# Patient Record
Sex: Female | Born: 1980 | Hispanic: No | Marital: Married | Smoking: Former smoker
Health system: Southern US, Community
[De-identification: ages and names within clinical notes are randomized; demographics above are authoritative.]

## PROBLEM LIST (undated history)

## (undated) DIAGNOSIS — F1120 Opioid dependence, uncomplicated: Secondary | ICD-10-CM

## (undated) DIAGNOSIS — F419 Anxiety disorder, unspecified: Secondary | ICD-10-CM

## (undated) DIAGNOSIS — Z800 Family history of malignant neoplasm of digestive organs: Secondary | ICD-10-CM

## (undated) DIAGNOSIS — F32A Depression, unspecified: Secondary | ICD-10-CM

## (undated) DIAGNOSIS — B1920 Unspecified viral hepatitis C without hepatic coma: Secondary | ICD-10-CM

## (undated) DIAGNOSIS — F4310 Post-traumatic stress disorder, unspecified: Secondary | ICD-10-CM

## (undated) HISTORY — DX: Depression, unspecified: F32.A

## (undated) HISTORY — DX: Anxiety disorder, unspecified: F41.9

## (undated) HISTORY — DX: Unspecified viral hepatitis C without hepatic coma: B19.20

## (undated) HISTORY — DX: Post-traumatic stress disorder, unspecified: F43.10

## (undated) HISTORY — DX: Family history of malignant neoplasm of digestive organs: Z80.0

---

## 2019-11-30 DIAGNOSIS — J069 Acute upper respiratory infection, unspecified: Secondary | ICD-10-CM | POA: Diagnosis not present

## 2019-11-30 DIAGNOSIS — Z20822 Contact with and (suspected) exposure to covid-19: Secondary | ICD-10-CM | POA: Diagnosis not present

## 2020-06-12 ENCOUNTER — Ambulatory Visit: Payer: Self-pay | Admitting: Internal Medicine

## 2020-06-17 ENCOUNTER — Encounter: Payer: Self-pay | Admitting: Emergency Medicine

## 2020-06-17 ENCOUNTER — Other Ambulatory Visit: Payer: Self-pay

## 2020-06-17 DIAGNOSIS — R109 Unspecified abdominal pain: Secondary | ICD-10-CM | POA: Diagnosis not present

## 2020-06-17 DIAGNOSIS — F17210 Nicotine dependence, cigarettes, uncomplicated: Secondary | ICD-10-CM | POA: Insufficient documentation

## 2020-06-17 DIAGNOSIS — R1012 Left upper quadrant pain: Secondary | ICD-10-CM | POA: Diagnosis present

## 2020-06-17 DIAGNOSIS — R112 Nausea with vomiting, unspecified: Secondary | ICD-10-CM | POA: Diagnosis not present

## 2020-06-17 LAB — URINALYSIS, COMPLETE (UACMP) WITH MICROSCOPIC
Bacteria, UA: NONE SEEN
Bilirubin Urine: NEGATIVE
Glucose, UA: NEGATIVE mg/dL
Hgb urine dipstick: NEGATIVE
Ketones, ur: NEGATIVE mg/dL
Nitrite: NEGATIVE
Protein, ur: NEGATIVE mg/dL
Specific Gravity, Urine: 1.016 (ref 1.005–1.030)
pH: 6 (ref 5.0–8.0)

## 2020-06-17 LAB — CBC
HCT: 38.9 % (ref 36.0–46.0)
Hemoglobin: 12.6 g/dL (ref 12.0–15.0)
MCH: 28.5 pg (ref 26.0–34.0)
MCHC: 32.4 g/dL (ref 30.0–36.0)
MCV: 88 fL (ref 80.0–100.0)
Platelets: 282 10*3/uL (ref 150–400)
RBC: 4.42 MIL/uL (ref 3.87–5.11)
RDW: 13.2 % (ref 11.5–15.5)
WBC: 8.3 10*3/uL (ref 4.0–10.5)
nRBC: 0 % (ref 0.0–0.2)

## 2020-06-17 LAB — BASIC METABOLIC PANEL
Anion gap: 10 (ref 5–15)
BUN: 12 mg/dL (ref 6–20)
CO2: 19 mmol/L — ABNORMAL LOW (ref 22–32)
Calcium: 8.9 mg/dL (ref 8.9–10.3)
Chloride: 107 mmol/L (ref 98–111)
Creatinine, Ser: 0.91 mg/dL (ref 0.44–1.00)
GFR, Estimated: >60 mL/min (ref >60)
Glucose, Bld: 117 mg/dL — ABNORMAL HIGH (ref 70–99)
Potassium: 4.4 mmol/L (ref 3.5–5.1)
Sodium: 136 mmol/L (ref 135–145)

## 2020-06-17 LAB — POC URINE PREG, ED: Preg Test, Ur: NEGATIVE

## 2020-06-17 MED ORDER — FENTANYL CITRATE (PF) 100 MCG/2ML IJ SOLN
50.0000 ug | INTRAMUSCULAR | Status: DC | PRN
  Administered 2020-06-17: 50 ug via INTRAVENOUS
  Filled 2020-06-17: qty 2

## 2020-06-17 NOTE — ED Triage Notes (Signed)
Pt to ED from home c/o left flank pain that started suddenly at 1900 tonight, nausea but no vomiting.  Denies hx of kidney stones, has not urinated since pain started.  Pain is constant with intermittent severe sharp pain to left flank.  Patient unable to sit still and tearful in triage.

## 2020-06-18 ENCOUNTER — Encounter: Payer: Self-pay | Admitting: Emergency Medicine

## 2020-06-18 ENCOUNTER — Emergency Department: Payer: Medicaid Other

## 2020-06-18 ENCOUNTER — Emergency Department
Admission: EM | Admit: 2020-06-18 | Discharge: 2020-06-18 | Disposition: A | Discharge status: Home / Self Care | Payer: Medicaid Other | Attending: Emergency Medicine | Admitting: Emergency Medicine

## 2020-06-18 DIAGNOSIS — R109 Unspecified abdominal pain: Secondary | ICD-10-CM | POA: Diagnosis not present

## 2020-06-18 HISTORY — DX: Opioid dependence, uncomplicated: F11.20

## 2020-06-18 MED ORDER — KETOROLAC TROMETHAMINE 30 MG/ML IJ SOLN
15.0000 mg | Freq: Once | INTRAMUSCULAR | Status: AC
  Administered 2020-06-18: 15 mg via INTRAVENOUS
  Filled 2020-06-18: qty 1

## 2020-06-18 MED ORDER — DROPERIDOL 2.5 MG/ML IJ SOLN
2.5000 mg | Freq: Once | INTRAMUSCULAR | Status: AC
  Administered 2020-06-18: 2.5 mg via INTRAVENOUS
  Filled 2020-06-18: qty 2

## 2020-06-18 NOTE — ED Provider Notes (Signed)
[facility] Emergency Department Provider Note  ____________________________________________   First MD Initiated Contact with Patient 06/18/20 0540     (approximate)  I have reviewed the triage vital signs and the nursing notes.   HISTORY  Chief Complaint Flank Pain    HPI Lori Lawson is a 39 y.o. female with medical history as listed below who presents for evaluation of acute in onset sharp and stabbing pain in her left flank.  She said that happened around 7 PM tonight with no warning and no history of trauma.  The pain is severe and nonradiating.  It is accompanied with nausea and some vomiting.  She cannot find a position of comfort and was engaged in a lot of movement and pacing in triage and while she was waiting for an extended period of time to be seen (due to high ED patient volumes).  She has no pain in her pelvis or lower abdomen.  She has no dysuria and has not seen any blood in her urine.  She denies fever/chills, sore throat, chest pain, shortness of breath.  She has no history of kidney stones.         Past Medical History:  Diagnosis Date  . Opioid dependence (HCC)    prescribed Suboxone by her primary doctor    There are no problems to display for this patient.   Past Surgical History:  Procedure Laterality Date  . CESAREAN SECTION      Prior to Admission medications   Not on File    Allergies Patient has no known allergies.  History reviewed. No pertinent family history.  Social History Social History   Tobacco Use  . Smoking status: Current Every Day Smoker    Packs/day: 0.50    Types: Cigarettes  . Smokeless tobacco: Never Used  Substance Use Topics  . Alcohol use: Yes    Comment: occ.  . Drug use: Yes    Types: Marijuana    Review of Systems Constitutional: No fever/chills Eyes: No visual changes. ENT: No sore throat. Cardiovascular: Denies chest pain. Respiratory: Denies shortness of  breath. Gastrointestinal: Severe left flank pain accompanied with nausea and vomiting.  No abdominal pain.  No diarrhea.  No constipation. Genitourinary: Negative for dysuria. Musculoskeletal: Negative for neck pain.  Negative for back pain. Integumentary: Negative for rash. Neurological: Negative for headaches, focal weakness or numbness.   ____________________________________________   PHYSICAL EXAM:  VITAL SIGNS: ED Triage Vitals  Enc Vitals Group     BP 06/17/20 2147 120/86     Pulse Rate 06/17/20 2147 93     Resp 06/17/20 2147 20     Temp 06/17/20 2147 98.9 F (37.2 C)     Temp Source 06/17/20 2147 Oral     SpO2 06/17/20 2147 100 %     Weight 06/17/20 2148 61.2 kg (135 lb)     Height 06/17/20 2148 1.575 m (5\' 2" )     Head Circumference --      Peak Flow --      Pain Score 06/17/20 2148 10     Pain Loc --      Pain Edu? --      Excl. in GC? --     Constitutional: Alert and oriented.  Patient appears uncomfortable. Eyes: Conjunctivae are normal.  Head: Atraumatic. Nose: No congestion/rhinnorhea. Mouth/Throat: Patient is wearing a mask. Neck: No stridor.  No meningeal signs.   Cardiovascular: Normal rate, regular rhythm. Good peripheral circulation. Grossly normal heart sounds.  Respiratory: Normal respiratory effort.  No retractions. Gastrointestinal: Soft and nontender. No distention.  Musculoskeletal: Left CVA tenderness to percussion.  No lower extremity tenderness nor edema. No gross deformities of extremities. Neurologic:  Normal speech and language. No gross focal neurologic deficits are appreciated.  Skin:  Skin is warm, dry and intact. Psychiatric: Mood and affect are normal. Speech and behavior are normal.  ____________________________________________   LABS (all labs ordered are listed, but only abnormal results are displayed)  Labs Reviewed  URINALYSIS, COMPLETE (UACMP) WITH MICROSCOPIC - Abnormal; Notable for the following components:      Result  Value   Color, Urine YELLOW (*)    APPearance HAZY (*)    Leukocytes,Ua SMALL (*)    All other components within normal limits  BASIC METABOLIC PANEL - Abnormal; Notable for the following components:   CO2 19 (*)    Glucose, Bld 117 (*)    All other components within normal limits  CBC  POC URINE PREG, ED   ____________________________________________  EKG  No indication for emergent EKG ____________________________________________  RADIOLOGY I, Loleta Rose, personally viewed and evaluated these images (plain radiographs) as part of my medical decision making, as well as reviewing the written report by the radiologist.  ED MD interpretation: Extensive desiccated stool burden most of the colon, no obvious ureteral calculus other than a probable punctate right renal calculus, some calcification versus blood products at a nonenlarged right ovary.  Official radiology report(s): CT Renal Stone Study  Result Date: 06/18/2020 CLINICAL DATA:  Left flank pain with kidney stone suspected EXAM: CT ABDOMEN AND PELVIS WITHOUT CONTRAST TECHNIQUE: Multidetector CT imaging of the abdomen and pelvis was performed following the standard protocol without IV contrast. COMPARISON:  None. FINDINGS: Lower chest:  No contributory findings. Hepatobiliary: No focal liver abnormality.No evidence of biliary obstruction or stone. Pancreas: Unremarkable. Spleen: Unremarkable. Adrenals/Urinary Tract: Negative adrenals. No hydronephrosis or visible ureteral calculus. A Punctate right renal calculus is likely based on coronal reformats. Unremarkable bladder. Stomach/Bowel: No obstruction. No appendicitis. Desiccated stool throughout much of the colon. Vascular/Lymphatic: No acute vascular abnormality. No mass or adenopathy. Reproductive:High-density at the dependent right ovary which could be mineralization or blood products. No ovarian enlargement and this is contralateral to the side of symptoms. Other: No ascites or  pneumoperitoneum. Musculoskeletal: No acute abnormalities. IMPRESSION: 1. No hydronephrosis or ureteral calculus. 2. Desiccated stool in much of the colon. 3. Calcification versus is blood products at the nonenlarged right ovary, contralateral to the side of reported symptoms. 4. Probable punctate right renal calculus. Electronically Signed   By: Marnee Spring M.D.   On: 06/18/2020 05:55    ____________________________________________   PROCEDURES   Procedure(s) performed (including Critical Care):  Procedures   ____________________________________________   INITIAL IMPRESSION / MDM / ASSESSMENT AND PLAN / ED COURSE  As part of my medical decision making, I reviewed the following data within the electronic MEDICAL RECORD NUMBER Nursing notes reviewed and incorporated, Labs reviewed , Old chart reviewed, Notes from prior ED visits and [place] Controlled Substance Database   Differential diagnosis includes, but is not limited to, renal/ureteral colic, musculoskeletal pain, UTI/pyelonephritis, less likely renal infarction.  Vital signs been stable throughout her lengthy wait in the emergency department in spite of her acute pain.  She received fentanyl while she was waiting and then had an episode of vomiting.  Urine pregnancy test is negative, basic metabolic panel is normal, CBC is normal, urinalysis is unremarkable with a small amount of leukocytes but  no evidence of hematuria.  Based on the description of the patient's symptoms I strongly suspected ureteral colic.  I ordered a CT renal stone protocol and there was no evidence of an emergent or acute abnormality that would explain the patient's pain in her left flank.  I suspect that she may have had a stone which she passed while waiting and is still having some residual discomfort.  I reviewed her record and the [place] controlled substance database and verify that she takes Suboxone.  I had my usual and customary kidney stone/ureteral  colic discussion with the patient explained that there is not a stone visible now so she should start feeling better soon.  I also explained the degree of constipation seen on her scan and mention that this can also contribute to abdominal and back pain.  I gave her a dose of droperidol 2.5 mg IV and Toradol 15 mg IV and strongly encouraged her to follow-up as an outpatient and continue taking her regular medications.  She says she understands the plan and I gave my usual and customary return precautions.           ____________________________________________  FINAL CLINICAL IMPRESSION(S) / ED DIAGNOSES  Final diagnoses:  Left flank pain     MEDICATIONS GIVEN DURING THIS VISIT:  Medications  fentaNYL (SUBLIMAZE) injection 50 mcg (50 mcg Intravenous Given 06/17/20 2200)  droperidol (INAPSINE) 2.5 MG/ML injection 2.5 mg (2.5 mg Intravenous Given 06/18/20 0619)  ketorolac (TORADOL) 30 MG/ML injection 15 mg (15 mg Intravenous Given 06/18/20 1660)     ED Discharge Orders    None      *Please note:  Lori Lawson was evaluated in Emergency Department on 06/18/2020 for the symptoms described in the history of present illness. She was evaluated in the context of the global COVID-19 pandemic, which necessitated consideration that the patient might be at risk for infection with the SARS-CoV-2 virus that causes COVID-19. Institutional protocols and algorithms that pertain to the evaluation of patients at risk for COVID-19 are in a state of rapid change based on information released by regulatory bodies including the CDC and federal and state organizations. These policies and algorithms were followed during the patient's care in the ED.  Some ED evaluations and interventions may be delayed as a result of limited staffing during and after the pandemic.*  Note:  This document was prepared using Dragon voice recognition software and may include unintentional dictation errors.   Loleta Rose,  MD 06/18/20 [phone]

## 2020-06-18 NOTE — Discharge Instructions (Signed)
Your workup in the Emergency Department today was reassuring.  We did not find any specific abnormalities.  You may have had a kidney stone that you passed and are still feeling some pain.  We recommend you drink plenty of fluids, take your regular medications and/or any new ones prescribed today, and follow up with the doctor(s) listed in these documents as recommended.  Return to the Emergency Department if you develop new or worsening symptoms that concern you.

## 2020-07-22 ENCOUNTER — Ambulatory Visit: Payer: Self-pay | Admitting: Internal Medicine

## 2020-07-22 ENCOUNTER — Encounter: Payer: Self-pay | Admitting: Internal Medicine

## 2020-08-14 ENCOUNTER — Ambulatory Visit: Payer: Self-pay | Admitting: Internal Medicine

## 2020-09-25 ENCOUNTER — Other Ambulatory Visit: Payer: Self-pay

## 2020-09-25 ENCOUNTER — Ambulatory Visit (INDEPENDENT_AMBULATORY_CARE_PROVIDER_SITE_OTHER): Payer: Medicaid Other | Admitting: Internal Medicine

## 2020-09-25 ENCOUNTER — Encounter: Payer: Self-pay | Admitting: Internal Medicine

## 2020-09-25 VITALS — BP 126/92 | HR 99 | Temp 98.6°F | Ht 62.0 in | Wt 136.0 lb

## 2020-09-25 DIAGNOSIS — F331 Major depressive disorder, recurrent, moderate: Secondary | ICD-10-CM

## 2020-09-25 DIAGNOSIS — B1920 Unspecified viral hepatitis C without hepatic coma: Secondary | ICD-10-CM | POA: Insufficient documentation

## 2020-09-25 NOTE — Progress Notes (Signed)
Date:  09/25/2020   Name:  Lori Lawson   DOB:  03-Feb-1981   MRN:  [id number]   Chief Complaint: Establish Care  Depression        This is a chronic problem.  Associated symptoms include no fatigue and no headaches.  Past treatments include SSRIs - Selective serotonin reuptake inhibitors and other medications.  Compliance with treatment is good.  Hepatitis C - she was diagnosed about 2 years ago when she was hospitalized with a tylenol overdose.  She was about to begin treatment when she decided to move here.  She denies abdominal pain, jaundice, weight loss.  Hx of drug use - started with pills then moved to heroin.  She was able to get her life in order and began Suboxone several years ago.  She has maintained on that more or less continuously.  She moved here [place] be with family [place]   Lab Results  Component Value Date   CREATININE 0.91 06/17/2020   BUN 12 06/17/2020   NA 136 06/17/2020   K 4.4 06/17/2020   CL 107 06/17/2020   CO2 19 (L) 06/17/2020   No results found for: CHOL, HDL, LDLCALC, LDLDIRECT, TRIG, CHOLHDL No results found for: TSH No results found for: HGBA1C Lab Results  Component Value Date   WBC 8.3 06/17/2020   HGB 12.6 06/17/2020   HCT 38.9 06/17/2020   MCV 88.0 06/17/2020   PLT 282 06/17/2020   No results found for: ALT, AST, GGT, ALKPHOS, BILITOT   Review of Systems  Constitutional: Negative for chills, fatigue, fever and unexpected weight change.  Respiratory: Negative for cough, chest tightness, shortness of breath and wheezing.   Cardiovascular: Negative for chest pain and palpitations.  Gastrointestinal: Negative for abdominal pain.  Genitourinary: Negative for difficulty urinating.  Musculoskeletal: Negative for arthralgias, gait problem and joint swelling.  Neurological: Negative for dizziness, light-headedness and headaches.  Psychiatric/Behavioral: Positive for  depression and dysphoric mood. Negative for sleep disturbance. The patient is nervous/anxious.     There are no problems to display for this patient.   No Known Allergies  Past Surgical History:  Procedure Laterality Date  . CESAREAN SECTION      Social History   Tobacco Use  . Smoking status: Current Every Day Smoker    Packs/day: 0.00    Types: Cigarettes  . Smokeless tobacco: Never Used  . Tobacco comment: in the process of quitting - 3 cigs a day   Vaping Use  . Vaping Use: Every day  Substance Use Topics  . Alcohol use: Yes    Comment: occ.  . Drug use: Yes    Types: Marijuana     Medication list has been reviewed and updated.  Current Meds  Medication Sig  . buprenorphine-naloxone (SUBOXONE) 8-2 mg SUBL SL tablet Place 1 tablet under the tongue in the morning and at bedtime.  Marland Kitchen buPROPion (WELLBUTRIN XL) 150 MG 24 hr tablet Take 150 mg by mouth in the morning and at bedtime.  . busPIRone (BUSPAR) 15 MG tablet Take 15 mg by mouth in the morning and at bedtime.  . hydrOXYzine (VISTARIL) 50 MG capsule Take 50 mg by mouth 3 (three) times daily as needed. [facility] [employer]  . ibuprofen (ADVIL) 600 MG tablet Take 600 mg by mouth as needed.  . sertraline (ZOLOFT) 100 MG tablet Take 200 mg by mouth daily. Dr.  Tr[facility]    [facility] 2/9 Scores 09/25/2020  PHQ - 2 Score 2  PHQ- 9 Score 10    GAD 7 : Generalized Anxiety Score 09/25/2020  Nervous, Anxious, on Edge 3  Control/stop worrying 3  Worry too much - different things 3  Trouble relaxing 3  Restless 3  Easily annoyed or irritable 2  Afraid - awful might happen 1  Total GAD 7 Score 18    BP Readings from Last 3 Encounters:  09/25/20 (!) 126/92  06/18/20 117/86    Physical Exam Vitals and nursing note reviewed.  Constitutional:      General: She is not in acute distress.    Appearance: Normal appearance. She is well-developed.  HENT:     Head: Normocephalic and atraumatic.  Cardiovascular:     Rate  and Rhythm: Normal rate and regular rhythm.     Pulses: Normal pulses.     Heart sounds: No murmur heard.   Pulmonary:     Effort: Pulmonary effort is normal. No respiratory distress.  Abdominal:     General: Abdomen is flat. Bowel sounds are normal. There is no distension.     Palpations: Abdomen is soft.     Tenderness: There is no abdominal tenderness. There is no guarding or rebound.  Musculoskeletal:     Cervical back: Normal range of motion.     Right lower leg: No edema.     Left lower leg: No edema.  Lymphadenopathy:     Cervical: No cervical adenopathy.  Skin:    General: Skin is warm and dry.     Findings: No rash.  Neurological:     General: No focal deficit present.     Mental Status: She is alert and oriented to person, place, and time.  Psychiatric:        Mood and Affect: Mood normal.        Behavior: Behavior normal.     Wt Readings from Last 3 Encounters:  09/25/20 136 lb (61.7 kg)  06/17/20 135 lb (61.2 kg)    BP (!) 126/92   Pulse 99   Temp 98.6 F (37 C) (Oral)   Ht 5\' 2"  (1.575 m)   Wt 136 lb (61.7 kg)   LMP 09/12/2019   SpO2 98%   BMI 24.87 kg/m   Assessment and Plan: 1. Hepatitis C virus infection without hepatic coma, unspecified chronicity Will obtain labs then refer for treatment if still active - Hepatitis C RNA quantitative - Comprehensive metabolic panel - CBC with Differential/Platelet  2. Moderate episode of recurrent major depressive disorder (HCC) Recommend continuing with [facility] for now She can look for another program but they are providing Suboxone so I would not change - TSH  Schedule CPX and Pap Partially dictated using [facility]. Any errors are unintentional.  Animal nutritionist, MD [facility]  09/25/2020

## 2020-09-26 LAB — COMPREHENSIVE METABOLIC PANEL
ALT: 48 IU/L — ABNORMAL HIGH (ref 0–32)
AST: 49 IU/L — ABNORMAL HIGH (ref 0–40)
Albumin/Globulin Ratio: 1.5 (ref 1.2–2.2)
Albumin: 4.6 g/dL (ref 3.8–4.8)
Alkaline Phosphatase: 95 IU/L (ref 44–121)
BUN/Creatinine Ratio: 16 (ref 9–23)
BUN: 13 mg/dL (ref 6–20)
Bilirubin Total: 0.3 mg/dL (ref 0.0–1.2)
CO2: 22 mmol/L (ref 20–29)
Calcium: 9.7 mg/dL (ref 8.7–10.2)
Chloride: 100 mmol/L (ref 96–106)
Creatinine, Ser: 0.81 mg/dL (ref 0.57–1.00)
Globulin, Total: 3 g/dL (ref 1.5–4.5)
Glucose: 84 mg/dL (ref 65–99)
Potassium: 4.5 mmol/L (ref 3.5–5.2)
Sodium: 135 mmol/L (ref 134–144)
Total Protein: 7.6 g/dL (ref 6.0–8.5)
eGFR: 95 mL/min/{1.73_m2} (ref >59)

## 2020-09-26 LAB — CBC WITH DIFFERENTIAL/PLATELET
Basophils Absolute: 0 10*3/uL (ref 0.0–0.2)
Basos: 0 %
EOS (ABSOLUTE): 0.1 10*3/uL (ref 0.0–0.4)
Eos: 2 %
Hematocrit: 40.1 % (ref 34.0–46.6)
Hemoglobin: 13.2 g/dL (ref 11.1–15.9)
Immature Grans (Abs): 0 10*3/uL (ref 0.0–0.1)
Immature Granulocytes: 0 %
Lymphocytes Absolute: 1.4 10*3/uL (ref 0.7–3.1)
Lymphs: 29 %
MCH: 28.6 pg (ref 26.6–33.0)
MCHC: 32.9 g/dL (ref 31.5–35.7)
MCV: 87 fL (ref 79–97)
Monocytes Absolute: 0.5 10*3/uL (ref 0.1–0.9)
Monocytes: 10 %
Neutrophils Absolute: 2.8 10*3/uL (ref 1.4–7.0)
Neutrophils: 59 %
Platelets: 355 10*3/uL (ref 150–450)
RBC: 4.62 x10E6/uL (ref 3.77–5.28)
RDW: 13.2 % (ref 11.7–15.4)
WBC: 4.8 10*3/uL (ref 3.4–10.8)

## 2020-09-26 LAB — TSH: TSH: 1.68 u[IU]/mL (ref 0.450–4.500)

## 2020-09-26 NOTE — Addendum Note (Signed)
Addended by: Reubin Milan on: 09/26/2020 02:02 PM   Modules accepted: Orders

## 2020-09-28 ENCOUNTER — Other Ambulatory Visit: Payer: Self-pay | Admitting: Internal Medicine

## 2020-09-28 DIAGNOSIS — B1920 Unspecified viral hepatitis C without hepatic coma: Secondary | ICD-10-CM

## 2020-09-28 LAB — HEPATITIS C GENOTYPE: Hepatitis C Genotype: 3

## 2020-09-28 LAB — HCV RNA QUANT RFLX ULTRA OR GENOTYP
HCV Quant Baseline: 162000 IU/mL
HCV log10: 5.21 log10 IU/mL

## 2020-11-19 ENCOUNTER — Ambulatory Visit (INDEPENDENT_AMBULATORY_CARE_PROVIDER_SITE_OTHER): Payer: Medicaid Other | Admitting: Gastroenterology

## 2020-11-19 ENCOUNTER — Encounter: Payer: Self-pay | Admitting: Gastroenterology

## 2020-11-19 VITALS — BP 101/69 | HR 92 | Ht 62.0 in | Wt 155.2 lb

## 2020-11-19 DIAGNOSIS — B182 Chronic viral hepatitis C: Secondary | ICD-10-CM

## 2020-11-19 NOTE — Progress Notes (Signed)
Gastroenterology Consultation  Referring Provider:     Reubin Milan, MD Primary Care Physician:  Lori Milan, MD Primary Gastroenterologist:  Dr. Servando Snare     Reason for Consultation:     Hepatitis C        HPI:   Lori Lawson is a 40 y.o. y/o female referred for consultation & management of hepatitis C by Dr. Judithann Lawson, Lori Cowden, MD.  This patient comes in today after being found to have abnormal liver enzymes and had her hepatitis C checked by her primary care provider and was found to have genotype 3 with a viral load of 162,000 and international units per milliliter.  It was reported that the patient was diagnosed 2 years prior to seeing her primary care provider when she was admitted to the hospital for a Tylenol overdose.  There is a history of heroin abuse. The patient reports that she has not been using IV drugs anymore.  She also states that she does not use any other illicit substances except occasional marijuana use.  Past Medical History:  Diagnosis Date  . Anxiety   . Depression   . Hepatitis C   . Opioid dependence (HCC)    prescribed Suboxone by her primary doctor  . PTSD (post-traumatic stress disorder)     Past Surgical History:  Procedure Laterality Date  . CESAREAN SECTION      Prior to Admission medications   Medication Sig Start Date End Date Taking? Authorizing Provider  buprenorphine-naloxone (SUBOXONE) 8-2 mg SUBL SL tablet Place 1 tablet under the tongue in the morning and at bedtime.    [provider]  buPROPion (WELLBUTRIN XL) 150 MG 24 hr tablet Take 150 mg by mouth in the morning and at bedtime.    [provider]  busPIRone (BUSPAR) 15 MG tablet Take 15 mg by mouth in the morning and at bedtime.    [provider]  hydrOXYzine (VISTARIL) 50 MG capsule Take 50 mg by mouth 3 (three) times daily as needed. [facility] [employer], Historical, MD  ibuprofen (ADVIL) 600 MG tablet Take 600 mg by mouth as  needed.    [provider]  sertraline (ZOLOFT) 100 MG tablet Take 200 mg by mouth daily. Dr.   Evlyn Clines    [provider]    Family History  Problem Relation Age of Onset  . Pancreatic cancer Mother 107  . Diabetes Mother   . Alcohol abuse Mother   . Hyperlipidemia Mother   . Hypertension Mother   . Tuberculosis Paternal Grandmother   . Lung cancer Paternal Grandfather      Social History   Tobacco Use  . Smoking status: Current Every Day Smoker    Packs/day: 0.00    Types: Cigarettes  . Smokeless tobacco: Never Used  . Tobacco comment: in the process of quitting - 3 cigs a day   Vaping Use  . Vaping Use: Every day  Substance Use Topics  . Alcohol use: Yes    Comment: occ.  . Drug use: Yes    Types: Marijuana    Allergies as of 11/19/2020  . (No Known Allergies)    Review of Systems:    All systems reviewed and negative except where noted in HPI.   Physical Exam:  There were no vitals taken for this visit. No LMP recorded. General:   Alert,  Well-developed, well-nourished, pleasant and cooperative in NAD Head:  Normocephalic and atraumatic. Eyes:  Sclera  clear, no icterus.   Conjunctiva pink. Ears:  Normal auditory acuity. Neck:  Supple; no masses or thyromegaly. Lungs:  Respirations even and unlabored.  Clear throughout to auscultation.   No wheezes, crackles, or rhonchi. No acute distress. Heart:  Regular rate and rhythm; no murmurs, clicks, rubs, or gallops. Abdomen:  Normal bowel sounds.  No bruits.  Soft, non-tender and non-distended without masses, hepatosplenomegaly or hernias noted.  No guarding or rebound tenderness.  Negative Carnett sign.   Rectal:  Deferred.  Pulses:  Normal pulses noted. Extremities:  No clubbing or edema.  No cyanosis. Neurologic:  Alert and oriented x3;  grossly normal neurologically. Skin:  Intact without significant lesions or rashes.  No jaundice. Lymph Nodes:  No significant cervical adenopathy. Psych:   Alert and cooperative. Normal mood and affect.  Imaging Studies: No results found.  Assessment and Plan:   Lori Lawson is a 40 y.o. y/o female who comes in today with a history of abnormal liver enzymes with the resulting finding of hepatitis C genotype 3 with a viral level of 162,000 international units/mL.  The patient will have lab sent off for other possible cause of abnormal liver enzymes and she will also have the amount of fibrosis determined.  She will also be checked for her immunity to hepatitis A and hepatitis B and will be vaccinated accordingly.  Once this is back the patient will then be started on treatment for her hepatitis C.  The patient has been explained the plan and agrees with it    Lori Minium, MD. Lori Lawson    Note: This dictation was prepared with Dragon dictation along with smaller phrase technology. Any transcriptional errors that result from this process are unintentional.

## 2020-11-22 DIAGNOSIS — B182 Chronic viral hepatitis C: Secondary | ICD-10-CM | POA: Diagnosis not present

## 2020-11-26 DIAGNOSIS — N390 Urinary tract infection, site not specified: Secondary | ICD-10-CM | POA: Diagnosis not present

## 2020-11-26 LAB — HCV FIBROSURE
ALPHA 2-MACROGLOBULINS, QN: 234 mg/dL (ref 110–276)
ALT (SGPT) P5P: 44 IU/L — ABNORMAL HIGH (ref 0–40)
Apolipoprotein A-1: 202 mg/dL (ref 116–209)
Bilirubin, Total: <0.1 mg/dL (ref 0.0–1.2)
Fibrosis Score: 0.03 (ref 0.00–0.21)
GGT: 29 IU/L (ref 0–60)
Haptoglobin: 143 mg/dL (ref 33–278)
Necroinflammat Activity Score: 0.18 — ABNORMAL HIGH (ref 0.00–0.17)

## 2020-11-26 LAB — HEPATITIS B SURFACE ANTIGEN: Hepatitis B Surface Ag: NEGATIVE

## 2020-11-26 LAB — IRON AND TIBC
Iron Saturation: 23 % (ref 15–55)
Iron: 94 ug/dL (ref 27–159)
Total Iron Binding Capacity: 404 ug/dL (ref 250–450)
UIBC: 310 ug/dL (ref 131–425)

## 2020-11-26 LAB — FERRITIN: Ferritin: 18 ng/mL (ref 15–150)

## 2020-11-26 LAB — ANTI-SMOOTH MUSCLE ANTIBODY, IGG: Smooth Muscle Ab: 8 Units (ref 0–19)

## 2020-11-26 LAB — HEPATIC FUNCTION PANEL
ALT: 42 IU/L — ABNORMAL HIGH (ref 0–32)
AST: 57 IU/L — ABNORMAL HIGH (ref 0–40)
Albumin: 4.4 g/dL (ref 3.8–4.8)
Alkaline Phosphatase: 130 IU/L — ABNORMAL HIGH (ref 44–121)
Bilirubin Total: 0.2 mg/dL (ref 0.0–1.2)
Bilirubin, Direct: <0.1 mg/dL (ref 0.00–0.40)
Total Protein: 7 g/dL (ref 6.0–8.5)

## 2020-11-26 LAB — HEPATITIS A ANTIBODY, TOTAL: hep A Total Ab: NEGATIVE

## 2020-11-26 LAB — ALPHA-1-ANTITRYPSIN: A-1 Antitrypsin: 133 mg/dL (ref 100–188)

## 2020-11-26 LAB — MITOCHONDRIAL ANTIBODIES: Mitochondrial Ab: <20 Units (ref 0.0–20.0)

## 2020-11-26 LAB — HEPATITIS B SURFACE ANTIBODY,QUALITATIVE: Hep B Surface Ab, Qual: NONREACTIVE

## 2020-11-26 LAB — CERULOPLASMIN: Ceruloplasmin: 30.6 mg/dL (ref 19.0–39.0)

## 2020-11-26 LAB — ANA: Anti Nuclear Antibody (ANA): NEGATIVE

## 2020-11-27 ENCOUNTER — Telehealth: Payer: Self-pay

## 2020-11-27 ENCOUNTER — Ambulatory Visit: Payer: Medicaid Other | Admitting: Internal Medicine

## 2020-11-27 NOTE — Telephone Encounter (Signed)
-----   Message from Midge Minium, MD sent at 11/25/2020 11:52 AM EDT ----- Let the patient know that she needs to be vaccinated for hepatitis A and hepatitis B.  Once the fibrosis score comes back then we can start her on treatment for her hepatitis C.

## 2020-11-27 NOTE — Telephone Encounter (Signed)
Left vm again for pt to return my call. Results have been sent through mychart.

## 2020-12-03 ENCOUNTER — Telehealth: Payer: Self-pay

## 2020-12-03 ENCOUNTER — Other Ambulatory Visit: Payer: Self-pay

## 2020-12-03 MED ORDER — MAVYRET 100-40 MG PO TABS: 3.0000 | ORAL_TABLET | Freq: Every day | ORAL | 1 refills | Status: AC

## 2020-12-03 NOTE — Telephone Encounter (Signed)
-----   Message from Midge Minium, MD sent at 12/02/2020 12:08 PM EDT ----- Let the patient know the fibrosis score came back and there was no fibrosis.  The patient should be started on her treatment for hepatitis C

## 2020-12-03 NOTE — Telephone Encounter (Signed)
Pt has been notified of lab results. Paperwork for Hep C treatment has been completed and faxed to [employer].

## 2020-12-09 ENCOUNTER — Other Ambulatory Visit: Payer: Self-pay

## 2020-12-09 ENCOUNTER — Ambulatory Visit: Payer: Medicaid Other

## 2020-12-09 DIAGNOSIS — Z23 Encounter for immunization: Secondary | ICD-10-CM

## 2020-12-09 MED ORDER — PANTOPRAZOLE SODIUM 40 MG PO TBEC: 40.0000 mg | DELAYED_RELEASE_TABLET | Freq: Every day | ORAL | 6 refills | Status: DC

## 2020-12-11 ENCOUNTER — Encounter: Payer: Self-pay | Admitting: Obstetrics

## 2020-12-11 ENCOUNTER — Ambulatory Visit (INDEPENDENT_AMBULATORY_CARE_PROVIDER_SITE_OTHER): Payer: Medicaid Other | Admitting: Obstetrics

## 2020-12-11 ENCOUNTER — Other Ambulatory Visit: Payer: Self-pay

## 2020-12-11 ENCOUNTER — Other Ambulatory Visit (HOSPITAL_COMMUNITY)
Admission: RE | Admit: 2020-12-11 | Discharge: 2020-12-11 | Disposition: A | Discharge status: Home / Self Care | Payer: Medicaid Other | Source: Ambulatory Visit | Attending: Obstetrics | Admitting: Obstetrics

## 2020-12-11 VITALS — BP 100/60 | Ht 61.5 in | Wt 144.0 lb

## 2020-12-11 DIAGNOSIS — Z113 Encounter for screening for infections with a predominantly sexual mode of transmission: Secondary | ICD-10-CM | POA: Diagnosis not present

## 2020-12-11 DIAGNOSIS — Z124 Encounter for screening for malignant neoplasm of cervix: Secondary | ICD-10-CM

## 2020-12-11 DIAGNOSIS — Z0000 Encounter for general adult medical examination without abnormal findings: Secondary | ICD-10-CM

## 2020-12-11 NOTE — Progress Notes (Signed)
Gynecology Annual Exam   PCP: Reubin Milan, MD  Chief Complaint:  Chief Complaint  Patient presents with  . Gynecologic Exam  . Contraception    History of Present Illness: Patient is a 40 y.o. [id number] presents for annual exam. The patient has no complaints today.   LMP: Patient's last menstrual period was 11/12/2020. Average Interval: regular, 28 days Duration of flow: 5 days Heavy Menses: no Clots: no Intermenstrual Bleeding: no Postcoital Bleeding: no Dysmenorrhea: no  The patient is sexually active. She currently uses none for contraception. She denies dyspareunia.  The patient does perform self breast exams.  There is no notable family history of breast or ovarian cancer in her family.  The patient wears seatbelts: yes.   The patient has regular exercise: no.    The patient denies current symptoms of depression.    Review of Systems: Review of Systems  Constitutional: Negative.   HENT: Negative.   Eyes: Negative.   Respiratory: Negative.   Cardiovascular: Negative.   Gastrointestinal: Negative.   Genitourinary: Negative.   Musculoskeletal: Negative.   Skin: Negative.   Neurological: Negative.   Endo/Heme/Allergies: Negative.   Psychiatric/Behavioral: The patient is nervous/anxious.     Past Medical History:  Patient Active Problem List   Diagnosis Date Noted  . Hepatitis C virus infection without hepatic coma 09/25/2020    Past Surgical History:  Past Surgical History:  Procedure Laterality Date  . CESAREAN SECTION      Gynecologic History:  Patient's last menstrual period was 11/12/2020. Contraception: none Last Pap: Results were: no abnormalities   Obstetric History: [id number]  Family History:  Family History  Problem Relation Age of Onset  . Pancreatic cancer Mother 37  . Diabetes Mother   . Alcohol abuse Mother   . Hyperlipidemia Mother   . Hypertension Mother   . Tuberculosis Paternal Grandmother   . Lung cancer Paternal  Grandfather     Social History:  Social History   Socioeconomic History  . Marital status: Single    Spouse name: Not on file  . Number of children: 1  . Years of education: Not on file  . Highest education level: Not on file  Occupational History  . Not on file  Tobacco Use  . Smoking status: Current Every Day Smoker    Packs/day: 0.00    Types: Cigarettes  . Smokeless tobacco: Never Used  . Tobacco comment: in the process of quitting - 3 cigs a day   Vaping Use  . Vaping Use: Every day  Substance and Sexual Activity  . Alcohol use: Yes    Comment: occ.  . Drug use: Yes    Types: Marijuana  . Sexual activity: Yes    Birth control/protection: None  Other Topics Concern  . Not on file  Social History Narrative  . Not on file   Social Determinants of Health   Financial Resource Strain: Not on file  Food Insecurity: Not on file  Transportation Needs: Not on file  Physical Activity: Not on file  Stress: Not on file  Social Connections: Not on file  Intimate Partner Violence: Not on file    Allergies:  No Known Allergies  Medications: Prior to Admission medications   Medication Sig Start Date End Date Taking? Authorizing Provider  buprenorphine-naloxone (SUBOXONE) 8-2 mg SUBL SL tablet Place 1 tablet under the tongue in the morning and at bedtime.   Yes [provider]  buPROPion (WELLBUTRIN XL) 150 MG 24 hr  tablet Take 150 mg by mouth in the morning and at bedtime.   Yes [provider]  busPIRone (BUSPAR) 15 MG tablet Take 15 mg by mouth in the morning and at bedtime.   Yes [provider]  Glecaprevir-Pibrentasvir (MAVYRET) 100-40 MG TABS Take 3 tablets by mouth daily for 28 days. 300mg /120mg  12/03/20 12/31/20 Yes Wohl, Darren, MD  hydrOXYzine (VISTARIL) 50 MG capsule Take 50 mg by mouth 3 (three) times daily as needed. [facility]   Yes [provider]  ibuprofen (ADVIL) 600 MG tablet Take 600 mg by mouth as needed.    Yes [provider]  pantoprazole (PROTONIX) 40 MG tablet Take 1 tablet (40 mg total) by mouth daily. 12/09/20  Yes 12/11/20, MD  sertraline (ZOLOFT) 100 MG tablet Take 200 mg by mouth daily. Dr.   Midge Minium   Yes [provider]    Physical Exam Vitals: Blood pressure 100/60, height 5' 1.5" (1.562 m), weight 144 lb (65.3 kg), last menstrual period 11/12/2020.  General: NAD, but talks rapidly during the visit- Appears anxious. HEENT: normocephalic, anicteric Thyroid: no enlargement, no palpable nodules Pulmonary: No increased work of breathing, CTAB Cardiovascular: RRR, distal pulses 2+ Breast: Breast symmetrical, no tenderness, no palpable nodules or masses, no skin or nipple retraction present, no nipple discharge.  No axillary or supraclavicular lymphadenopathy. Abdomen: NABS, soft, non-tender, non-distended.  Umbilicus without lesions.  No hepatomegaly, splenomegaly or masses palpable. No evidence of hernia  Genitourinary:  External: Normal external female genitalia.  Normal urethral meatus, normal Bartholin's and Skene's glands.    Vagina: Normal vaginal mucosa, no evidence of prolapse.  Narrow pelvis noted, with narrow arch and outlet.  Cervix: Grossly normal in appearance, no bleeding  Uterus: Non-enlarged, mobile, normal contour.  No CMT  Adnexa: ovaries non-enlarged, no adnexal masses  Rectal: deferred  Lymphatic: no evidence of inguinal lymphadenopathy Extremities: no edema, erythema, or tenderness Neurologic: Grossly intact Psychiatric: mood appropriate, affect full  Female chaperone present for pelvic and breast  portions of the physical exam    Assessment: 40 y.o. 24 routine annual exam STI screening Pap smear Declining contraceptive management  Plan: Problem List Items Addressed This Visit   None     2) STI screening  wasoffered and accepted  2)  ASCCP guidelines and rational discussed.  Patient opts for every 3 years screening  interval  3) Contraception - the patient is currently using  none and she has been sexually active for years and not gotten pregnanct. Wh was told years ago that she likely has PCOS..  She is happy with her current form of contraception and plans to continue. She would welcome a pregnancy.  4) Routine healthcare maintenance including cholesterol, diabetes screening discussed managed by PCP  5) No follow-ups on file.   [id number], CNM  12/11/2020 6:51 PM   [facility], [facility] 12/11/2020, 3:31 PM

## 2020-12-12 ENCOUNTER — Encounter: Payer: Self-pay | Admitting: Obstetrics and Gynecology

## 2020-12-13 ENCOUNTER — Other Ambulatory Visit: Payer: Self-pay | Admitting: Obstetrics

## 2020-12-13 DIAGNOSIS — Z30011 Encounter for initial prescription of contraceptive pills: Secondary | ICD-10-CM | POA: Diagnosis not present

## 2020-12-13 LAB — CYTOLOGY - PAP
Adequacy: ABSENT
Chlamydia: NEGATIVE
Comment: NEGATIVE
Comment: NEGATIVE
Comment: NEGATIVE
Comment: NORMAL
Diagnosis: NEGATIVE
High risk HPV: NEGATIVE
Neisseria Gonorrhea: NEGATIVE
Trichomonas: NEGATIVE

## 2020-12-13 LAB — HEP, RPR, HIV PANEL
HIV Screen 4th Generation wRfx: NONREACTIVE
RPR Ser Ql: NONREACTIVE

## 2020-12-13 LAB — HEPATITIS B SURFACE AG, CONFIRM: HBsAG Confirmation: POSITIVE — AB

## 2020-12-13 MED ORDER — NORETHIN ACE-ETH ESTRAD-FE 1-20 MG-MCG PO TABS: 1.0000 | ORAL_TABLET | Freq: Every day | ORAL | 11 refills | Status: DC

## 2020-12-13 NOTE — Progress Notes (Signed)
Gynecology Annual Exam   PCP: Reubin Milan, MD  Chief Complaint: No chief complaint on file.   History of Present Illness: Patient is a 40 y.o. [id number] presents for annual exam. The patient has no complaints today.   LMP: No LMP recorded. Average Interval: regular, 28 days Duration of flow: 3 days Heavy Menses: no Clots: no Intermenstrual Bleeding: no Postcoital Bleeding: no Dysmenorrhea: no  The patient is sexually active. She currently uses none for contraception. She denies dyspareunia.  The patient does not perform self breast exams.  There is no notable family history of breast or ovarian cancer in her family.  The patient wears seatbelts: yes.   The patient has regular exercise: yes.    The patient denies current symptoms of depression.    Review of Systems: ROS  Past Medical History:  Patient Active Problem List   Diagnosis Date Noted  . Hepatitis C virus infection without hepatic coma 09/25/2020    Past Surgical History:  Past Surgical History:  Procedure Laterality Date  . CESAREAN SECTION      Gynecologic History:  No LMP recorded. Contraception: none Last Pap: Results were: unknown- she is over due for a pap   Obstetric History: [id number]  Family History:  Family History  Problem Relation Age of Onset  . Pancreatic cancer Mother 77  . Diabetes Mother   . Alcohol abuse Mother   . Hyperlipidemia Mother   . Hypertension Mother   . Tuberculosis Paternal Grandmother   . Lung cancer Paternal Grandfather     Social History:  Social History   Socioeconomic History  . Marital status: Single    Spouse name: Not on file  . Number of children: 1  . Years of education: Not on file  . Highest education level: Not on file  Occupational History  . Not on file  Tobacco Use  . Smoking status: Current Every Day Smoker    Packs/day: 0.00    Types: Cigarettes  . Smokeless tobacco: Never Used  . Tobacco comment: in the process of quitting - 3  cigs a day   Vaping Use  . Vaping Use: Every day  Substance and Sexual Activity  . Alcohol use: Yes    Comment: occ.  . Drug use: Yes    Types: Marijuana  . Sexual activity: Yes    Birth control/protection: None  Other Topics Concern  . Not on file  Social History Narrative  . Not on file   Social Determinants of Health   Financial Resource Strain: Not on file  Food Insecurity: Not on file  Transportation Needs: Not on file  Physical Activity: Not on file  Stress: Not on file  Social Connections: Not on file  Intimate Partner Violence: Not on file    Allergies:  No Known Allergies  Medications: Prior to Admission medications   Medication Sig Start Date End Date Taking? Authorizing Provider  norethindrone-ethinyl estradiol-FE (JUNEL FE 1/20) 1-20 MG-MCG tablet Take 1 tablet by mouth daily. 12/13/20  Yes Mirna Mires, CNM  buprenorphine-naloxone (SUBOXONE) 8-2 mg SUBL SL tablet Place 1 tablet under the tongue in the morning and at bedtime.    [provider]  buPROPion (WELLBUTRIN XL) 150 MG 24 hr tablet Take 150 mg by mouth in the morning and at bedtime.    [provider]  busPIRone (BUSPAR) 15 MG tablet Take 15 mg by mouth in the morning and at bedtime.    [provider]  Glecaprevir-Pibrentasvir (MAVYRET) 100-40 MG TABS Take 3 tablets by mouth daily for 28 days. 300mg /120mg  12/03/20 12/31/20  01/02/21, MD  hydrOXYzine (VISTARIL) 50 MG capsule Take 50 mg by mouth 3 (three) times daily as needed. [facility] Midge Minium, Historical, MD  ibuprofen (ADVIL) 600 MG tablet Take 600 mg by mouth as needed.    [provider]  pantoprazole (PROTONIX) 40 MG tablet Take 1 tablet (40 mg total) by mouth daily. 12/09/20   12/11/20, MD  sertraline (ZOLOFT) 100 MG tablet Take 200 mg by mouth daily. Dr.   Midge Minium    [provider]    Physical Exam Vitals: There were no vitals taken for this visit.  General:  NAD HEENT: normocephalic, anicteric Thyroid: no enlargement, no palpable nodules Pulmonary: No increased work of breathing, CTAB Cardiovascular: RRR, distal pulses 2+ Breast: Breast symmetrical, no tenderness, no palpable nodules or masses, no skin or nipple retraction present, no nipple discharge.  No axillary or supraclavicular lymphadenopathy. Abdomen: NABS, soft, non-tender, non-distended.  Umbilicus without lesions.  No hepatomegaly, splenomegaly or masses palpable. No evidence of hernia  Genitourinary:  External: Normal external female genitalia.  Normal urethral meatus, normal Bartholin's and Skene's glands.    Vagina: Normal vaginal mucosa, no evidence of prolapse.    Cervix: Grossly normal in appearance, no bleeding  Uterus: Non-enlarged, mobile, normal contour.  No CMT  Adnexa: ovaries non-enlarged, no adnexal masses  Rectal: deferred  Lymphatic: no evidence of inguinal lymphadenopathy Extremities: no edema, erythema, or tenderness Neurologic: Grossly intact Psychiatric: mood appropriate, affect full  Female chaperone present for pelvic and breast  portions of the physical exam    Assessment: 40 y.o. 24 routine annual exam  Plan: Problem List Items Addressed This Visit   None   Visit Diagnoses    Encounter for initial prescription of contraceptive pills    -  Primary   Relevant Medications   norethindrone-ethinyl estradiol-FE (JUNEL FE 1/20) 1-20 MG-MCG tablet      2) STI screening  wasoffered and accepted  2)  ASCCP guidelines and rational discussed.  Patient opts for every 3 years screening interval  3) Contraception - the patient is currently using  none.  She is interested in starting Contraception: and has decided to trail a low dose OCP. a prescription for Junel is sent to her pharmacy, and she is instructed in how to start the pills. Condom use is advised.  4) Routine healthcare maintenance including cholesterol, diabetes screening discussed managed by  PCP  5) No follow-ups on file.   2/20, CNM  12/13/2020 5:21 PM   [facility], [facility] 12/13/2020, 5:20 PM

## 2020-12-20 NOTE — Progress Notes (Signed)
Patient seen recently for an annual exam.had STI screening, including blood work. Her Hap BsAG is positive. I have called her  and left a VM asking her to call the office to discuss. She is Hepatitiis C positive by history.She likely needs follow up and LFTs, other labwork. Mirna Mires, CNM  12/20/2020 9:59 AM

## 2020-12-23 ENCOUNTER — Other Ambulatory Visit: Payer: Self-pay

## 2020-12-23 MED ORDER — ONDANSETRON HCL 4 MG PO TABS: 4.0000 mg | ORAL_TABLET | Freq: Three times a day (TID) | ORAL | 1 refills | Status: AC | PRN

## 2020-12-26 ENCOUNTER — Other Ambulatory Visit: Payer: Self-pay

## 2020-12-26 DIAGNOSIS — B182 Chronic viral hepatitis C: Secondary | ICD-10-CM

## 2020-12-30 NOTE — Progress Notes (Signed)
Attempted to contact the patient again by phone- no answer. Left a voicemail asking hr to call the office so that we can discuss her positive Hepatitits B test result.  Mirna Mires, CNM  12/30/2020 8:34 AM

## 2020-12-31 ENCOUNTER — Telehealth: Payer: Self-pay

## 2020-12-31 NOTE — Telephone Encounter (Signed)
Pt notified of lab results through MyChart.  

## 2020-12-31 NOTE — Telephone Encounter (Signed)
-----   Message from Midge Minium, MD sent at 12/29/2020  8:33 AM EDT ----- That the patient know that her hepatitis D which can come with hepatitis B was negative. ----- Message ----- From: Interface, [facility] Lab Results In Sent: 12/27/2020   7:38 AM EDT To: Midge Minium, MD

## 2021-01-02 LAB — HEPATITIS D QRT-PCR (SERUM): HDV qRT-PCR (serum): NOT DETECTED

## 2021-01-02 LAB — HEPATITIS B CORE ANTIBODY, IGM: Hep B C IgM: NEGATIVE

## 2021-01-03 ENCOUNTER — Telehealth: Payer: Self-pay

## 2021-01-03 NOTE — Telephone Encounter (Signed)
-----   Message from Midge Minium, MD sent at 01/02/2021  5:27 PM EDT ----- Let the patient know that her hepatitis D was negative.  She should have her hepatitis be antigen checked again in a month.

## 2021-01-03 NOTE — Telephone Encounter (Signed)
Pt notified of lab results through Mychart. 

## 2021-01-13 ENCOUNTER — Other Ambulatory Visit (INDEPENDENT_AMBULATORY_CARE_PROVIDER_SITE_OTHER): Payer: Medicaid Other

## 2021-01-13 ENCOUNTER — Other Ambulatory Visit: Payer: Self-pay

## 2021-01-15 ENCOUNTER — Ambulatory Visit (INDEPENDENT_AMBULATORY_CARE_PROVIDER_SITE_OTHER): Payer: Medicaid Other | Admitting: Gastroenterology

## 2021-01-15 ENCOUNTER — Other Ambulatory Visit: Payer: Self-pay

## 2021-01-15 DIAGNOSIS — Z23 Encounter for immunization: Secondary | ICD-10-CM

## 2021-01-15 NOTE — Progress Notes (Signed)
Pt here for Twinrix vaccine #2. Pt tolerated well.

## 2021-01-16 ENCOUNTER — Other Ambulatory Visit: Payer: Self-pay | Admitting: Gastroenterology

## 2021-01-27 ENCOUNTER — Encounter: Payer: Medicaid Other | Admitting: Internal Medicine

## 2021-04-15 NOTE — Progress Notes (Deleted)
Date:  04/16/2021   Name:  Lori Lawson   DOB:  09-Mar-1981   MRN:  [id number]   Chief Complaint: No chief complaint on file. Lori Lawson is a 40 y.o. female who presents today for her Complete Annual Exam. She feels {DESC; WELL/FAIRLY WELL/POORLY:18703}. She reports exercising ***. She reports she is sleeping {DESC; WELL/FAIRLY WELL/POORLY:18703}. Breast complaints ***.  Mammogram: due DEXA: none Pap smear: 11/2020 neg with co testing by GYN Colonoscopy: not due  Immunization History  Administered Date(s) Administered   Hep A / Hep B 12/09/2020, 01/15/2021    HPI  Lab Results  Component Value Date   CREATININE 0.81 09/25/2020   BUN 13 09/25/2020   NA 135 09/25/2020   K 4.5 09/25/2020   CL 100 09/25/2020   CO2 22 09/25/2020   No results found for: CHOL, HDL, LDLCALC, LDLDIRECT, TRIG, CHOLHDL Lab Results  Component Value Date   TSH 1.680 09/25/2020   No results found for: HGBA1C Lab Results  Component Value Date   WBC 4.8 09/25/2020   HGB 13.2 09/25/2020   HCT 40.1 09/25/2020   MCV 87 09/25/2020   PLT 355 09/25/2020   Lab Results  Component Value Date   ALT 42 (H) 11/22/2020   AST 57 (H) 11/22/2020   ALKPHOS 130 (H) 11/22/2020   BILITOT 0.2 11/22/2020     Review of Systems  Constitutional:  Negative for chills, fatigue and fever.  HENT:  Negative for congestion, hearing loss, tinnitus, trouble swallowing and voice change.   Eyes:  Negative for visual disturbance.  Respiratory:  Negative for cough, chest tightness, shortness of breath and wheezing.   Cardiovascular:  Negative for chest pain, palpitations and leg swelling.  Gastrointestinal:  Negative for abdominal pain, constipation, diarrhea and vomiting.  Endocrine: Negative for polydipsia and polyuria.  Genitourinary:  Negative for dysuria, frequency, genital sores, vaginal bleeding and vaginal discharge.  Musculoskeletal:  Negative for arthralgias, gait problem and joint swelling.  Skin:  Negative  for color change and rash.  Neurological:  Negative for dizziness, tremors, light-headedness and headaches.  Hematological:  Negative for adenopathy. Does not bruise/bleed easily.  Psychiatric/Behavioral:  Negative for dysphoric mood and sleep disturbance. The patient is not nervous/anxious.    Patient Active Problem List   Diagnosis Date Noted   Hepatitis C virus infection without hepatic coma 09/25/2020    No Known Allergies  Past Surgical History:  Procedure Laterality Date   CESAREAN SECTION      Social History   Tobacco Use   Smoking status: Every Day    Packs/day: 0.00    Types: Cigarettes   Smokeless tobacco: Never   Tobacco comments:    in the process of quitting - 3 cigs a day   Vaping Use   Vaping Use: Every day  Substance Use Topics   Alcohol use: Yes    Comment: occ.   Drug use: Yes    Types: Marijuana     Medication list has been reviewed and updated.  No outpatient medications have been marked as taking for the 04/16/21 encounter (Appointment) with Reubin Milan, MD.    [facility] 2/9 Scores 09/25/2020  PHQ - 2 Score 2  PHQ- 9 Score 10    GAD 7 : Generalized Anxiety Score 09/25/2020  Nervous, Anxious, on Edge 3  Control/stop worrying 3  Worry too much - different things 3  Trouble relaxing 3  Restless 3  Easily annoyed or irritable 2  Afraid - awful might happen  1  Total GAD 7 Score 18    BP Readings from Last 3 Encounters:  12/11/20 100/60  11/19/20 101/69  09/25/20 (!) 126/92    Physical Exam Vitals and nursing note reviewed.  Constitutional:      General: She is not in acute distress.    Appearance: She is well-developed.  HENT:     Head: Normocephalic and atraumatic.     Right Ear: Tympanic membrane and ear canal normal.     Left Ear: Tympanic membrane and ear canal normal.     Nose:     Right Sinus: No maxillary sinus tenderness.     Left Sinus: No maxillary sinus tenderness.  Eyes:     General: No scleral icterus.       Right  eye: No discharge.        Left eye: No discharge.     Conjunctiva/sclera: Conjunctivae normal.  Neck:     Thyroid: No thyromegaly.     Vascular: No carotid bruit.  Cardiovascular:     Rate and Rhythm: Normal rate and regular rhythm.     Pulses: Normal pulses.     Heart sounds: Normal heart sounds.  Pulmonary:     Effort: Pulmonary effort is normal. No respiratory distress.     Breath sounds: No wheezing.  Chest:  Breasts:    Right: No mass, nipple discharge, skin change or tenderness.     Left: No mass, nipple discharge, skin change or tenderness.  Abdominal:     General: Bowel sounds are normal.     Palpations: Abdomen is soft.     Tenderness: There is no abdominal tenderness.  Musculoskeletal:     Cervical back: Normal range of motion. No erythema.     Right lower leg: No edema.     Left lower leg: No edema.  Lymphadenopathy:     Cervical: No cervical adenopathy.  Skin:    General: Skin is warm and dry.     Findings: No rash.  Neurological:     Mental Status: She is alert and oriented to person, place, and time.     Cranial Nerves: No cranial nerve deficit.     Sensory: No sensory deficit.     Deep Tendon Reflexes: Reflexes are normal and symmetric.  Psychiatric:        Attention and Perception: Attention normal.        Mood and Affect: Mood normal.    Wt Readings from Last 3 Encounters:  12/11/20 144 lb (65.3 kg)  11/19/20 155 lb 3.2 oz (70.4 kg)  09/25/20 136 lb (61.7 kg)    There were no vitals taken for this visit.  Assessment and Plan:

## 2021-04-16 ENCOUNTER — Encounter: Payer: Medicaid Other | Admitting: Internal Medicine

## 2021-05-07 ENCOUNTER — Other Ambulatory Visit: Payer: Self-pay

## 2021-05-07 ENCOUNTER — Encounter: Payer: Self-pay | Admitting: Gastroenterology

## 2021-05-07 ENCOUNTER — Ambulatory Visit (INDEPENDENT_AMBULATORY_CARE_PROVIDER_SITE_OTHER): Payer: Medicaid Other | Admitting: Gastroenterology

## 2021-05-07 VITALS — BP 140/85 | HR 85 | Temp 97.8°F | Ht 61.5 in | Wt 149.8 lb

## 2021-05-07 DIAGNOSIS — K5900 Constipation, unspecified: Secondary | ICD-10-CM | POA: Diagnosis not present

## 2021-05-07 DIAGNOSIS — B182 Chronic viral hepatitis C: Secondary | ICD-10-CM

## 2021-05-07 NOTE — Progress Notes (Signed)
Primary Care Physician: Glean Hess, MD  Primary Gastroenterologist:  Dr. Lucilla Lame  Chief Complaint  Patient presents with   Follow up Hepatitis C    Constipation    HPI: Lori Lawson is a 40 y.o. female here for follow-up of her hepatitis C. The patient reports that she finished her treatment back in June.  The patient states that she had some nausea with the treatment but otherwise deferred well.  There is no report of any abdominal pain fevers chills nausea or vomiting this time.  The patient states she feels much better since her treatment was instituted.  She does report that she has been having constipation that she thinks has been getting worse.  Past Medical History:  Diagnosis Date   Anxiety    Depression    Family history of pancreatic cancer    in her mom, BRCA neg per pt report   Hepatitis C    Opioid dependence ([place])    prescribed Suboxone by her primary doctor   PTSD (post-traumatic stress disorder)     Current Outpatient Medications  Medication Sig Dispense Refill   buprenorphine-naloxone (SUBOXONE) 8-2 mg SUBL SL tablet Place 1 tablet under the tongue in the morning and at bedtime.     buPROPion (WELLBUTRIN XL) 150 MG 24 hr tablet Take 150 mg by mouth in the morning and at bedtime.     busPIRone (BUSPAR) 15 MG tablet Take 15 mg by mouth in the morning and at bedtime.     hydrOXYzine (VISTARIL) 50 MG capsule Take 50 mg by mouth 3 (three) times daily as needed. [facility]     ibuprofen (ADVIL) 600 MG tablet Take 600 mg by mouth as needed.     norethindrone-ethinyl estradiol-FE (JUNEL FE 1/20) 1-20 MG-MCG tablet Take 1 tablet by mouth daily. 28 tablet 11   ondansetron (ZOFRAN) 4 MG tablet Take 1 tablet (4 mg total) by mouth every 8 (eight) hours as needed for nausea or vomiting. 20 tablet 1   pantoprazole (PROTONIX) 40 MG tablet Take 1 tablet (40 mg total) by mouth daily. 30 tablet 6   sertraline (ZOLOFT) 100 MG tablet Take 200 mg by mouth  daily. Dr.   Ellender Hose     No current facility-administered medications for this visit.    Allergies as of 05/07/2021   (No Known Allergies)    ROS:  General: Negative for anorexia, weight loss, fever, chills, fatigue, weakness. ENT: Negative for hoarseness, difficulty swallowing , nasal congestion. CV: Negative for chest pain, angina, palpitations, dyspnea on exertion, peripheral edema.  Respiratory: Negative for dyspnea at rest, dyspnea on exertion, cough, sputum, wheezing.  GI: See history of present illness. GU:  Negative for dysuria, hematuria, urinary incontinence, urinary frequency, nocturnal urination.  Endo: Negative for unusual weight change.    Physical Examination:   BP 140/85 (BP Location: Left Arm, Patient Position: Sitting, Cuff Size: Large)   Pulse 85   Temp 97.8 F (36.6 C) (Temporal)   Ht 5' 1.5" (1.562 m)   Wt 149 lb 12.8 oz (67.9 kg)   BMI 27.85 kg/m   General: Well-nourished, well-developed in no acute distress.  Eyes: No icterus. Conjunctivae pink. Neuro: Alert and oriented x 3.  Grossly intact. Skin: Warm and dry, no jaundice.   Psych: Alert and cooperative, normal mood and affect.  Labs:    Imaging Studies: No results found.  Assessment and Plan:   Lori Lawson is a 40 y.o. y/o female Who comes in  today with a history of hepatitis C and she is status post treatment.  The patient will have her labs checked today for her hepatitis C. She will have a viral load to see if she is cleared the virus.  The patient will also be set up for colonoscopy due to a change in bowel habits that she has been given samples of Linzess 145 g to be taken once a day.  The patient has been plan and agrees with it.     Lucilla Lame, MD. Marval Regal    Note: This dictation was prepared with Dragon dictation along with smaller phrase technology. Any transcriptional errors that result from this process are unintentional.

## 2021-05-09 LAB — HEPATIC FUNCTION PANEL
ALT: 13 IU/L (ref 0–32)
AST: 23 IU/L (ref 0–40)
Albumin: 4.9 g/dL — ABNORMAL HIGH (ref 3.8–4.8)
Alkaline Phosphatase: 88 IU/L (ref 44–121)
Bilirubin Total: 0.4 mg/dL (ref 0.0–1.2)
Bilirubin, Direct: 0.11 mg/dL (ref 0.00–0.40)
Total Protein: 7.5 g/dL (ref 6.0–8.5)

## 2021-05-09 LAB — HCV RNA QUANT: Hepatitis C Quantitation: NOT DETECTED IU/mL

## 2021-05-12 ENCOUNTER — Other Ambulatory Visit: Payer: Self-pay | Admitting: Internal Medicine

## 2021-05-12 ENCOUNTER — Ambulatory Visit: Payer: Medicaid Other | Admitting: Internal Medicine

## 2021-05-14 ENCOUNTER — Telehealth: Payer: Medicaid Other | Admitting: Physician Assistant

## 2021-05-14 DIAGNOSIS — R079 Chest pain, unspecified: Secondary | ICD-10-CM

## 2021-05-14 DIAGNOSIS — R6889 Other general symptoms and signs: Secondary | ICD-10-CM

## 2021-05-14 NOTE — Progress Notes (Signed)
Based on what you shared with me, I feel your condition warrants further evaluation and I recommend that you be seen in a face to face visit. Current symptoms are concerning with a flu-like virus. Giving your mention of chest/abdominal pain along with the fever though, you need to be seen in person to get a good examination to make sure nothing more serious going on along with getting flu swabbed.   NOTE: There will be NO CHARGE for this eVisit   If you are having a true medical emergency please call 911.      For an urgent face to face visit, [place] has six urgent care centers for your convenience:     [facility] Health [facility] Center at [facility] Directions [phone] [address] [place], [place] [address]    [facility] Health Urgent Care Center [facility]) Get Driving Directions [phone] [address] [place], [place] [address]  [facility] Health Urgent Care Center [facility]) Get Driving Directions [phone] [address] [place],  [place]  [address]  [facility] Health Urgent Care at [facility] Get Driving Directions [phone] [address] [place] [address] [place], [place] [address]   [facility] Health Urgent Care at [facility] Get Driving Directions  [phone] [address] [place], [place] [address]   [facility] Health Urgent Care at [facility] Directions [phone] [address] [place], [place] [address]  Your MyChart E-visit questionnaire answers were reviewed by a board certified advanced clinical practitioner to complete your personal care plan based on your specific symptoms.  Thank you for using e-Visits.

## 2021-05-15 ENCOUNTER — Telehealth: Payer: Self-pay

## 2021-05-15 NOTE — Telephone Encounter (Signed)
Pt notified of lab results

## 2021-05-15 NOTE — Telephone Encounter (Signed)
-----   Message from Midge Minium, MD sent at 05/11/2021  4:52 PM EDT ----- But the patient know that her liver enzymes are back to normal and her hepatitis C was Negative.  For the diagnosis of sustained viral response she will need a blood test one year after completion of treatment.

## 2021-05-16 ENCOUNTER — Ambulatory Visit
Admission: EM | Admit: 2021-05-16 | Discharge: 2021-05-16 | Disposition: A | Discharge status: Home / Self Care | Payer: Medicaid Other | Attending: Emergency Medicine | Admitting: Emergency Medicine

## 2021-05-16 ENCOUNTER — Other Ambulatory Visit: Payer: Self-pay

## 2021-05-16 ENCOUNTER — Encounter: Payer: Self-pay | Admitting: Emergency Medicine

## 2021-05-16 ENCOUNTER — Ambulatory Visit (INDEPENDENT_AMBULATORY_CARE_PROVIDER_SITE_OTHER): Payer: Medicaid Other

## 2021-05-16 DIAGNOSIS — J22 Unspecified acute lower respiratory infection: Secondary | ICD-10-CM

## 2021-05-16 DIAGNOSIS — R059 Cough, unspecified: Secondary | ICD-10-CM

## 2021-05-16 MED ORDER — DOXYCYCLINE HYCLATE 100 MG PO CAPS: 100.0000 mg | ORAL_CAPSULE | Freq: Two times a day (BID) | ORAL | 0 refills | Status: DC

## 2021-05-16 MED ORDER — ALBUTEROL SULFATE HFA 108 (90 BASE) MCG/ACT IN AERS
8.0000 | INHALATION_SPRAY | Freq: Once | RESPIRATORY_TRACT | Status: AC
  Administered 2021-05-16: 8 via RESPIRATORY_TRACT

## 2021-05-16 MED ORDER — PREDNISONE 10 MG PO TABS: ORAL_TABLET | ORAL | 0 refills | Status: AC

## 2021-05-16 NOTE — Discharge Instructions (Signed)
Take Doxycycline and prednisone as prescribed. Use your albuterol inhaler 1-2 puffs every 6-8 hours as needed to help with wheezing and shortness of breath.  Rest, push lots of fluids (especially water), and utilize supportive care for symptoms. You may take take acetaminophen (Tylenol) every 4-6 hours or ibuprofen every 6-8 hours for muscle pain, joint pain, headaches. Mucinex (guaifenesin) may be taken over the counter for cough as needed and can loosen phlegm. Please read the instructions and take as directed. Saline nasal sprays to rinse congestion can help as well. Warm tea with lemon and honey can sooth sore throat and cough, as can cough drops.  Take Mucinex for cough.  Return to clinic for new-onset fever, difficulty breathing, chest pain, symptoms lasting >3 to 4 weeks, or bloody sputum.

## 2021-05-16 NOTE — ED Provider Notes (Addendum)
CHIEF COMPLAINT:   Chief Complaint  Patient presents with   Cough   Nasal Congestion   Shortness of Breath   Fever     SUBJECTIVE/HPI:   Cough Associated symptoms: fever and shortness of breath   Shortness of Breath Associated symptoms: cough and fever   Fever Associated symptoms: cough   A very pleasant 40 y.o.Female presents today with cough, shortness of breath, fever, and congestion for four days. Patient does not report any chest pain, palpitations, visual changes, weakness, tingling, headache, nausea, vomiting, diarrhea, chills.   has a past medical history of Anxiety, Depression, Family history of pancreatic cancer, Hepatitis C, Opioid dependence (HCC), and PTSD (post-traumatic stress disorder).  ROS:  Review of Systems  Constitutional:  Positive for fever.  Respiratory:  Positive for cough and shortness of breath.   See Subjective/HPI Medications, Allergies and Problem List personally reviewed in Epic today OBJECTIVE:   Vitals:   05/16/21 1341  BP: (!) 142/91  Pulse: 99  Resp: 20  Temp: 98.6 F (37 C)  SpO2: 96%    Physical Exam   General: Appears well-developed and well-nourished. No acute distress.  HEENT Head: Normocephalic and atraumatic.   Ears: Hearing grossly intact, no drainage or visible deformity.  Nose: No nasal deviation.   Mouth/Throat: No stridor or tracheal deviation.  Non erythematous posterior pharynx noted with clear drainage present.  No white patchy exudate noted. Eyes: Conjunctivae and EOM are normal. No eye drainage or scleral icterus bilaterally.  Neck: Normal range of motion, neck is supple. Cardiovascular: Normal rate. Regular rhythm; no murmurs, gallops, or rubs.  Pulm/Chest: No respiratory distress. Left upper lobe wheezing, bilateral lower lobes with rhonchi. Right upper lobe CTA. Intermittent forceful cough noted. Neurological: Alert and oriented to person, place, and time.  Skin: Skin is warm and dry.  No rashes, lesions,  abrasions or bruising noted to skin.   Psychiatric: Normal mood, affect, behavior, and thought content.   Vital signs and nursing note reviewed.   Patient stable and cooperative with examination. PROCEDURES:    LABS/X-RAYS/EKG/MEDS:   No results found for any visits on 05/16/21.  MEDICAL DECISION MAKING:   Patient presents with cough, shortness of breath, fever, and congestion for four days. Patient does not report any chest pain, palpitations, visual changes, weakness, tingling, headache, nausea, vomiting, diarrhea, chills. CXR: Shows mild scattered opacities about the right lower lobe. As read by me, overread pending. Will rx Doxycycline for potential lower respiratory tract infection and prednisone. Patient will take home the albuterol inhaler used in clinic today to use as needed every 6-8 hours with 1-2 puffs to help with shortness of breath and wheezing.  Patient does report feeling better after receiving 8 puffs of an albuterol inhaler in clinic today.  Advised about home treatment and care to include rest, fluids, Tylenol, ibuprofen and Mucinex.  Return to clinic for any new onset fever, difficulty breathing, chest pain, symptoms lasting longer than 3 to 4 weeks or bloody sputum.  Patient verbalized understanding and agreed with treatment plan.  Patient stable upon discharge. ASSESSMENT/PLAN:  1. Lower respiratory tract infection - doxycycline (VIBRAMYCIN) 100 MG capsule; Take 1 capsule (100 mg total) by mouth 2 (two) times daily.  Dispense: 20 capsule; Refill: 0 - predniSONE (DELTASONE) 10 MG tablet; Take 6 tablets (60 mg total) by mouth daily for 1 day, THEN 5 tablets (50 mg total) daily for 1 day, THEN 4 tablets (40 mg total) daily for 1 day, THEN 3 tablets (30 mg  total) daily for 1 day, THEN 2 tablets (20 mg total) daily for 1 day, THEN 1 tablet (10 mg total) daily for 1 day.  Dispense: 21 tablet; Refill: 0 Instructions about new medications and side effects  provided.  Plan:   Discharge Instructions      Take Doxycycline and prednisone as prescribed. Use your albuterol inhaler 1-2 puffs every 6-8 hours as needed to help with wheezing and shortness of breath.  Rest, push lots of fluids (especially water), and utilize supportive care for symptoms. You may take take acetaminophen (Tylenol) every 4-6 hours or ibuprofen every 6-8 hours for muscle pain, joint pain, headaches. Mucinex (guaifenesin) may be taken over the counter for cough as needed and can loosen phlegm. Please read the instructions and take as directed. Saline nasal sprays to rinse congestion can help as well. Warm tea with lemon and honey can sooth sore throat and cough, as can cough drops.  Take Mucinex for cough.  Return to clinic for new-onset fever, difficulty breathing, chest pain, symptoms lasting >3 to 4 weeks, or bloody sputum.          Lori Greenhouse, FNP 05/16/21 1421    Lori Greenhouse, FNP 05/29/21 1228

## 2021-05-16 NOTE — ED Triage Notes (Signed)
Pt here with cough, SOB, fever and congestion x 4 days.

## 2021-05-18 LAB — COVID-19, FLU A+B AND RSV
Influenza A, NAA: DETECTED — AB
Influenza B, NAA: NOT DETECTED
RSV, NAA: NOT DETECTED
SARS-CoV-2, NAA: NOT DETECTED

## 2021-05-19 DIAGNOSIS — K5900 Constipation, unspecified: Secondary | ICD-10-CM

## 2021-05-20 MED ORDER — LINACLOTIDE 145 MCG PO CAPS: 145.0000 ug | ORAL_CAPSULE | Freq: Every day | ORAL | Status: DC

## 2021-05-23 ENCOUNTER — Other Ambulatory Visit: Payer: Self-pay

## 2021-05-23 DIAGNOSIS — K5900 Constipation, unspecified: Secondary | ICD-10-CM

## 2021-05-23 MED ORDER — LINACLOTIDE 145 MCG PO CAPS: 145.0000 ug | ORAL_CAPSULE | Freq: Every day | ORAL | Status: DC

## 2021-05-24 ENCOUNTER — Other Ambulatory Visit: Payer: Self-pay | Admitting: Gastroenterology

## 2021-05-26 ENCOUNTER — Other Ambulatory Visit: Payer: Self-pay

## 2021-05-26 DIAGNOSIS — K5900 Constipation, unspecified: Secondary | ICD-10-CM

## 2021-05-26 MED ORDER — LINACLOTIDE 145 MCG PO CAPS: 145.0000 ug | ORAL_CAPSULE | Freq: Every day | ORAL | 3 refills | Status: DC

## 2021-05-26 MED ORDER — LINACLOTIDE 145 MCG PO CAPS: 145.0000 ug | ORAL_CAPSULE | Freq: Every day | ORAL | Status: AC

## 2021-05-26 NOTE — Progress Notes (Signed)
Sent in corrected medicine to pharmacy 

## 2021-06-05 ENCOUNTER — Encounter: Payer: Self-pay | Admitting: Obstetrics

## 2021-06-10 DIAGNOSIS — F331 Major depressive disorder, recurrent, moderate: Secondary | ICD-10-CM | POA: Diagnosis not present

## 2021-06-19 DIAGNOSIS — Z419 Encounter for procedure for purposes other than remedying health state, unspecified: Secondary | ICD-10-CM | POA: Diagnosis not present

## 2021-06-24 ENCOUNTER — Other Ambulatory Visit: Payer: Self-pay | Admitting: Gastroenterology

## 2021-07-20 DIAGNOSIS — Z419 Encounter for procedure for purposes other than remedying health state, unspecified: Secondary | ICD-10-CM | POA: Diagnosis not present

## 2021-08-20 DIAGNOSIS — Z419 Encounter for procedure for purposes other than remedying health state, unspecified: Secondary | ICD-10-CM | POA: Diagnosis not present

## 2021-08-26 ENCOUNTER — Telehealth: Payer: Self-pay

## 2021-08-26 NOTE — Telephone Encounter (Signed)
Called patient and left a detailed message along with sending a my chart message informing pt I just tried to give her a call from our office. We have her down on the schedule for tomorrow afternoon to see Dr Army Melia for severe headache, nausea, and weakness. Severe headache and weakness is very concerning and we do not want her to have to wait until tomorrow afternoon to evaluate these things in person.  Dr. Army Melia recommends patient being evaluated at the Emergency Room and may need STAT labs, and IV Fluids to help with any dehydration she may having that's causing the weakness. Also, severe headache is also concerning and she may need a head CT which we cannot do quickly.  Asked patient to please go to the Emergency Room for evaluation for Severe Headache and Weakness.  PEC may give this information to patient if she returns the call.

## 2021-08-27 ENCOUNTER — Other Ambulatory Visit: Payer: Self-pay | Admitting: Internal Medicine

## 2021-08-27 ENCOUNTER — Ambulatory Visit (INDEPENDENT_AMBULATORY_CARE_PROVIDER_SITE_OTHER): Payer: Medicaid Other | Admitting: Internal Medicine

## 2021-08-27 ENCOUNTER — Other Ambulatory Visit: Payer: Self-pay

## 2021-08-27 ENCOUNTER — Encounter: Payer: Self-pay | Admitting: Internal Medicine

## 2021-08-27 VITALS — BP 120/85 | HR 101 | Ht 61.5 in | Wt 141.0 lb

## 2021-08-27 DIAGNOSIS — F411 Generalized anxiety disorder: Secondary | ICD-10-CM | POA: Insufficient documentation

## 2021-08-27 DIAGNOSIS — K5900 Constipation, unspecified: Secondary | ICD-10-CM

## 2021-08-27 DIAGNOSIS — J019 Acute sinusitis, unspecified: Secondary | ICD-10-CM

## 2021-08-27 DIAGNOSIS — F331 Major depressive disorder, recurrent, moderate: Secondary | ICD-10-CM | POA: Diagnosis not present

## 2021-08-27 DIAGNOSIS — R42 Dizziness and giddiness: Secondary | ICD-10-CM | POA: Diagnosis not present

## 2021-08-27 DIAGNOSIS — F324 Major depressive disorder, single episode, in partial remission: Secondary | ICD-10-CM | POA: Insufficient documentation

## 2021-08-27 MED ORDER — MECLIZINE HCL 25 MG PO TABS: 25.0000 mg | ORAL_TABLET | Freq: Three times a day (TID) | ORAL | 0 refills | Status: DC | PRN

## 2021-08-27 MED ORDER — AMOXICILLIN-POT CLAVULANATE 875-125 MG PO TABS: 1.0000 | ORAL_TABLET | Freq: Two times a day (BID) | ORAL | 0 refills | Status: AC

## 2021-08-27 NOTE — Progress Notes (Signed)
Date:  08/27/2021   Name:  Lori Lawson   DOB:  [birth year]   MRN:  [id number]   Chief Complaint: Dizziness (Nausea, headaches )  Dizziness This is a new problem. Episode onset: 2-3 weeks. Episode frequency: comes and goes. The problem has been gradually worsening. Associated symptoms include congestion, headaches, nausea and weakness. Pertinent negatives include no chest pain, coughing, diaphoresis, fatigue, fever or sore throat. The symptoms are aggravated by walking (passenger in car). She has tried acetaminophen and lying down (zofran) for the symptoms. The treatment provided mild relief.   Lab Results  Component Value Date   NA 135 09/25/2020   K 4.5 09/25/2020   CO2 22 09/25/2020   GLUCOSE 84 09/25/2020   BUN 13 09/25/2020   CREATININE 0.81 09/25/2020   CALCIUM 9.7 09/25/2020   EGFR 95 09/25/2020   GFRNONAA >60 06/17/2020   No results found for: CHOL, HDL, LDLCALC, LDLDIRECT, TRIG, CHOLHDL Lab Results  Component Value Date   TSH 1.680 09/25/2020   No results found for: HGBA1C Lab Results  Component Value Date   WBC 4.8 09/25/2020   HGB 13.2 09/25/2020   HCT 40.1 09/25/2020   MCV 87 09/25/2020   PLT 355 09/25/2020   Lab Results  Component Value Date   ALT 13 05/07/2021   AST 23 05/07/2021   ALKPHOS 88 05/07/2021   BILITOT 0.4 05/07/2021   No results found for: 25OHVITD2, 25OHVITD3, VD25OH   Review of Systems  Constitutional:  Negative for diaphoresis, fatigue and fever.  HENT:  Positive for congestion and sinus pressure. Negative for sore throat, tinnitus and trouble swallowing.   Respiratory:  Negative for cough, shortness of breath and wheezing.   Cardiovascular:  Negative for chest pain.  Gastrointestinal:  Positive for nausea.  Neurological:  Positive for dizziness, weakness and headaches.   Patient Active Problem List   Diagnosis Date Noted   Constipation in female 08/27/2021   Major depression single episode, in partial remission ([place]) 08/27/2021    Generalized anxiety disorder 08/27/2021   Hepatitis C virus infection without hepatic coma 09/25/2020    No Known Allergies  Past Surgical History:  Procedure Laterality Date   CESAREAN SECTION      Social History   Tobacco Use   Smoking status: Former    Packs/day: 0.00    Types: Cigarettes    Quit date: 01/17/2021    Years since quitting: 0.6   Smokeless tobacco: Never  Vaping Use   Vaping Use: Every day  Substance Use Topics   Alcohol use: Yes    Comment: occ.   Drug use: Yes    Types: Marijuana     Medication list has been reviewed and updated.  Current Meds  Medication Sig   amoxicillin-clavulanate (AUGMENTIN) 875-125 MG tablet Take 1 tablet by mouth 2 (two) times daily for 10 days.   buprenorphine (SUBUTEX) 8 MG SUBL SL tablet Place 8 mg under the tongue 2 (two) times daily. 4 MG   buPROPion (WELLBUTRIN SR) 150 MG 12 hr tablet Take 150 mg by mouth 2 (two) times daily.   busPIRone (BUSPAR) 15 MG tablet Take 15 mg by mouth in the morning and at bedtime.   hydrOXYzine (VISTARIL) 50 MG capsule Take 50 mg by mouth 3 (three) times daily as needed. [facility]   ibuprofen (ADVIL) 600 MG tablet Take 600 mg by mouth as needed.   linaclotide (LINZESS) 145 MCG CAPS capsule Take 1 capsule (145 mcg total) by mouth daily  before breakfast.   meclizine (ANTIVERT) 25 MG tablet Take 1 tablet (25 mg total) by mouth 3 (three) times daily as needed for dizziness.   Multiple Vitamin (MULTI-VITAMIN PO) Take by mouth daily.   norethindrone-ethinyl estradiol-FE (JUNEL FE 1/20) 1-20 MG-MCG tablet Take 1 tablet by mouth daily.   ondansetron (ZOFRAN) 4 MG tablet Take 1 tablet (4 mg total) by mouth every 8 (eight) hours as needed for nausea or vomiting.   pantoprazole (PROTONIX) 40 MG tablet TAKE 1 TABLET BY MOUTH EVERY DAY   sertraline (ZOLOFT) 100 MG tablet Take 200 mg by mouth daily. Dr.   Ellender Hose   VYVANSE 30 MG capsule Take 30 mg by mouth every morning.    PHQ 2/9  Scores 08/27/2021 09/25/2020  PHQ - 2 Score 1 2  PHQ- 9 Score 9 10    GAD 7 : Generalized Anxiety Score 08/27/2021 09/25/2020  Nervous, Anxious, on Edge 1 3  Control/stop worrying 1 3  Worry too much - different things 0 3  Trouble relaxing 0 3  Restless 1 3  Easily annoyed or irritable 1 2  Afraid - awful might happen 0 1  Total GAD 7 Score 4 18    BP Readings from Last 3 Encounters:  08/27/21 120/85  05/16/21 (!) 142/91  05/07/21 140/85    Physical Exam Constitutional:      Appearance: Normal appearance. She is well-developed.  HENT:     Right Ear: Ear canal and external ear normal. Tympanic membrane is not erythematous or retracted.     Left Ear: Ear canal and external ear normal. Tympanic membrane is not erythematous or retracted.     Nose:     Right Sinus: Maxillary sinus tenderness and frontal sinus tenderness present.     Left Sinus: Maxillary sinus tenderness and frontal sinus tenderness present.  Eyes:     Extraocular Movements:     Right eye: Nystagmus present.     Left eye: Nystagmus present.  Cardiovascular:     Rate and Rhythm: Normal rate and regular rhythm.     Heart sounds: Normal heart sounds.  Pulmonary:     Effort: Pulmonary effort is normal.     Breath sounds: Normal breath sounds. No wheezing or rales.  Musculoskeletal:     Cervical back: Normal range of motion.  Lymphadenopathy:     Cervical: No cervical adenopathy.  Skin:    General: Skin is warm and dry.  Neurological:     Mental Status: She is alert and oriented to person, place, and time.    Wt Readings from Last 3 Encounters:  08/27/21 141 lb (64 kg)  05/07/21 149 lb 12.8 oz (67.9 kg)  12/11/20 144 lb (65.3 kg)    BP 120/85 (BP Location: Right Leg, Cuff Size: Normal)    Pulse (!) 101    Ht 5' 1.5" (1.562 m)    Wt 141 lb (64 kg)    SpO2 96%    BMI 26.21 kg/m   Assessment and Plan: 1. Acute non-recurrent sinusitis, unspecified location Continue Flonase and hydration -  amoxicillin-clavulanate (AUGMENTIN) 875-125 MG tablet; Take 1 tablet by mouth 2 (two) times daily for 10 days.  Dispense: 20 tablet; Refill: 0  2. Vertigo Due to sinusitis Use meclizine as needed for severe symptoms Expect these to taper off over the next 1-2 weeks. - meclizine (ANTIVERT) 25 MG tablet; Take 1 tablet (25 mg total) by mouth 3 (three) times daily as needed for dizziness.  Dispense: 20 tablet; Refill:  0  3. Moderate episode of recurrent major depressive disorder (HCC) Followed by [facility].   Partially dictated using Editor, commissioning. Any errors are unintentional.  Halina Maidens, MD [facility]  08/27/2021

## 2021-08-27 NOTE — Progress Notes (Unsigned)
° ° °  Date:  08/27/2021   Name:  Lori Lawson   DOB:  04/01/1981   MRN:  [id number]   Chief Complaint: No chief complaint on file.  HPI  Lab Results  Component Value Date   NA 135 09/25/2020   K 4.5 09/25/2020   CO2 22 09/25/2020   GLUCOSE 84 09/25/2020   BUN 13 09/25/2020   CREATININE 0.81 09/25/2020   CALCIUM 9.7 09/25/2020   EGFR 95 09/25/2020   GFRNONAA >60 06/17/2020   No results found for: CHOL, HDL, LDLCALC, LDLDIRECT, TRIG, CHOLHDL Lab Results  Component Value Date   TSH 1.680 09/25/2020   No results found for: HGBA1C Lab Results  Component Value Date   WBC 4.8 09/25/2020   HGB 13.2 09/25/2020   HCT 40.1 09/25/2020   MCV 87 09/25/2020   PLT 355 09/25/2020   Lab Results  Component Value Date   ALT 13 05/07/2021   AST 23 05/07/2021   ALKPHOS 88 05/07/2021   BILITOT 0.4 05/07/2021   No results found for: 25OHVITD2, 25OHVITD3, VD25OH   Review of Systems  Patient Active Problem List   Diagnosis Date Noted   Constipation in female 08/27/2021   Hepatitis C virus infection without hepatic coma 09/25/2020    No Known Allergies  Past Surgical History:  Procedure Laterality Date   CESAREAN SECTION      Social History   Tobacco Use   Smoking status: Every Day    Packs/day: 0.00    Types: Cigarettes   Smokeless tobacco: Never   Tobacco comments:    in the process of quitting - 3 cigs a day   Vaping Use   Vaping Use: Every day  Substance Use Topics   Alcohol use: Yes    Comment: occ.   Drug use: Yes    Types: Marijuana     Medication list has been reviewed and updated.  No outpatient medications have been marked as taking for the 08/27/21 encounter (Orders Only) with Glean Hess, MD.    [facility] 2/9 Scores 09/25/2020  PHQ - 2 Score 2  PHQ- 9 Score 10    GAD 7 : Generalized Anxiety Score 09/25/2020  Nervous, Anxious, on Edge 3  Control/stop worrying 3  Worry too much - different things 3  Trouble relaxing 3  Restless 3  Easily annoyed or  irritable 2  Afraid - awful might happen 1  Total GAD 7 Score 18    BP Readings from Last 3 Encounters:  05/16/21 (!) 142/91  05/07/21 140/85  12/11/20 100/60    Physical Exam  Wt Readings from Last 3 Encounters:  05/07/21 149 lb 12.8 oz (67.9 kg)  12/11/20 144 lb (65.3 kg)  11/19/20 155 lb 3.2 oz (70.4 kg)    There were no vitals taken for this visit.  Assessment and Plan:

## 2021-09-17 DIAGNOSIS — Z419 Encounter for procedure for purposes other than remedying health state, unspecified: Secondary | ICD-10-CM | POA: Diagnosis not present

## 2021-09-21 ENCOUNTER — Other Ambulatory Visit: Payer: Self-pay | Admitting: Gastroenterology

## 2021-09-29 ENCOUNTER — Other Ambulatory Visit: Payer: Self-pay | Admitting: Obstetrics

## 2021-09-29 DIAGNOSIS — Z30011 Encounter for initial prescription of contraceptive pills: Secondary | ICD-10-CM

## 2021-10-18 DIAGNOSIS — Z419 Encounter for procedure for purposes other than remedying health state, unspecified: Secondary | ICD-10-CM | POA: Diagnosis not present

## 2021-10-23 ENCOUNTER — Telehealth: Payer: Self-pay

## 2021-10-23 DIAGNOSIS — B182 Chronic viral hepatitis C: Secondary | ICD-10-CM

## 2021-10-23 NOTE — Telephone Encounter (Signed)
Pt is aware as instructed and states she will come to the office next week to have labs drawn ?

## 2021-10-23 NOTE — Telephone Encounter (Deleted)
-----   Message from Ginger Feldpausch, CMA sent at 10/20/2021 12:03 PM EDT ----- ? ?----- Message ----- ?From: Feldpausch, Ginger, CMA ?Sent: 10/20/2021   9:00 AM EDT ?To: Ginger Feldpausch, CMA ? ?Repeat viral load and LFT's. ? ? ?

## 2021-10-23 NOTE — Telephone Encounter (Signed)
Error

## 2021-10-23 NOTE — Telephone Encounter (Signed)
-----   Message from Glennie Isle, [place] sent at 10/20/2021 12:03 PM EDT ----- ? ?----- Message ----- ?From: Glennie Isle, CMA ?Sent: 10/20/2021   9:00 AM EDT ?To: Ginger Feldpausch, CMA ? ?Repeat viral load and LFT's. ? ? ?

## 2021-11-17 DIAGNOSIS — Z419 Encounter for procedure for purposes other than remedying health state, unspecified: Secondary | ICD-10-CM | POA: Diagnosis not present

## 2021-11-24 ENCOUNTER — Other Ambulatory Visit: Payer: Self-pay | Admitting: Gastroenterology

## 2021-12-18 DIAGNOSIS — Z419 Encounter for procedure for purposes other than remedying health state, unspecified: Secondary | ICD-10-CM | POA: Diagnosis not present

## 2021-12-24 ENCOUNTER — Other Ambulatory Visit: Payer: Self-pay | Admitting: Gastroenterology

## 2022-01-08 ENCOUNTER — Other Ambulatory Visit: Payer: Self-pay | Admitting: Gastroenterology

## 2022-01-17 DIAGNOSIS — Z419 Encounter for procedure for purposes other than remedying health state, unspecified: Secondary | ICD-10-CM | POA: Diagnosis not present

## 2022-01-19 ENCOUNTER — Telehealth: Payer: Self-pay | Admitting: Internal Medicine

## 2022-01-19 NOTE — Telephone Encounter (Signed)
Spoke to pt let her know that I sent a list of behavioral health places to her mychart. Pt verbalized understanding.  KP

## 2022-01-19 NOTE — Telephone Encounter (Signed)
Pt is calling to request a referral to a new mental health doctor. Please advise

## 2022-01-26 ENCOUNTER — Other Ambulatory Visit: Payer: Self-pay | Admitting: Obstetrics

## 2022-01-26 DIAGNOSIS — Z30011 Encounter for initial prescription of contraceptive pills: Secondary | ICD-10-CM

## 2022-02-09 ENCOUNTER — Other Ambulatory Visit: Payer: Self-pay

## 2022-02-09 DIAGNOSIS — Z7689 Persons encountering health services in other specified circumstances: Secondary | ICD-10-CM

## 2022-02-09 NOTE — Telephone Encounter (Signed)
It is ok to send a referral?  KP

## 2022-02-16 ENCOUNTER — Telehealth: Payer: Self-pay | Admitting: Internal Medicine

## 2022-02-16 DIAGNOSIS — Z809 Family history of malignant neoplasm, unspecified: Secondary | ICD-10-CM | POA: Diagnosis not present

## 2022-02-16 DIAGNOSIS — R030 Elevated blood-pressure reading, without diagnosis of hypertension: Secondary | ICD-10-CM | POA: Diagnosis not present

## 2022-02-16 DIAGNOSIS — Z886 Allergy status to analgesic agent status: Secondary | ICD-10-CM | POA: Diagnosis not present

## 2022-02-16 DIAGNOSIS — Z8249 Family history of ischemic heart disease and other diseases of the circulatory system: Secondary | ICD-10-CM | POA: Diagnosis not present

## 2022-02-16 DIAGNOSIS — Z818 Family history of other mental and behavioral disorders: Secondary | ICD-10-CM | POA: Diagnosis not present

## 2022-02-16 DIAGNOSIS — Z833 Family history of diabetes mellitus: Secondary | ICD-10-CM | POA: Diagnosis not present

## 2022-02-16 DIAGNOSIS — F4310 Post-traumatic stress disorder, unspecified: Secondary | ICD-10-CM | POA: Diagnosis not present

## 2022-02-16 DIAGNOSIS — F902 Attention-deficit hyperactivity disorder, combined type: Secondary | ICD-10-CM | POA: Diagnosis not present

## 2022-02-16 DIAGNOSIS — F419 Anxiety disorder, unspecified: Secondary | ICD-10-CM | POA: Diagnosis not present

## 2022-02-16 DIAGNOSIS — F324 Major depressive disorder, single episode, in partial remission: Secondary | ICD-10-CM | POA: Diagnosis not present

## 2022-02-16 NOTE — Telephone Encounter (Signed)
Copied from CRM [id number]. Topic: Appointment Scheduling - Scheduling Inquiry for Clinic >> Feb 16, 2022  3:13 PM Haroldine Laws wrote: Reason for CRM: Pt called saying he will be out of her Vyvanse on Saturday.  She was thinking she would have to have an appt before it was refilled.  There are no appts until September.  Please advise [phone]

## 2022-02-17 DIAGNOSIS — Z419 Encounter for procedure for purposes other than remedying health state, unspecified: Secondary | ICD-10-CM | POA: Diagnosis not present

## 2022-02-17 NOTE — Telephone Encounter (Signed)
Spoke to pt she stated that she had an appt with [facility] 8/15 and needed medication until then. Told pt that Dr. Judithann Graves will be out of the office for the next 2 weeks. Told pt to go to the ER if she needed medication. Pt verbalized understanding.  KP

## 2022-02-19 ENCOUNTER — Ambulatory Visit
Admission: RE | Admit: 2022-02-19 | Discharge: 2022-02-19 | Disposition: A | Discharge status: Home / Self Care | Payer: Medicaid Other | Source: Ambulatory Visit | Attending: Family Medicine | Admitting: Family Medicine

## 2022-02-19 VITALS — BP 147/86 | HR 105 | Temp 98.6°F | Resp 18 | Ht 61.0 in | Wt 145.0 lb

## 2022-02-19 DIAGNOSIS — F909 Attention-deficit hyperactivity disorder, unspecified type: Secondary | ICD-10-CM

## 2022-02-19 DIAGNOSIS — Z760 Encounter for issue of repeat prescription: Secondary | ICD-10-CM | POA: Diagnosis not present

## 2022-02-19 MED ORDER — VYVANSE 30 MG PO CAPS: 30.0000 mg | ORAL_CAPSULE | Freq: Two times a day (BID) | ORAL | 0 refills | Status: DC

## 2022-02-19 NOTE — ED Provider Notes (Addendum)
[facility]    CSN: [id number] Arrival date & time: 02/19/22  1302      History   Chief Complaint Chief Complaint  Patient presents with   Medication Refill    My doctor closed his practice, with no notice or refills, and I don't have an appointment with the new doctor till August 15. Need a refill on my medication. - Entered by patient    HPI Lori Lawson is a 41 y.o. female.   HPI   Patient presents for prescription refill.  States that her psychiatrist office closed without notice.  She was looking for a new provider so she is okay with this.  States that she needs a refill on her Vyvanse.  She takes it twice a day.  She has an upcoming appointment with a new psychiatrist on August 15.  States her PCP told her to go to the ED or urgent care to get the medication refilled.  No other concerns today.    Past Medical History:  Diagnosis Date   Anxiety    Depression    Family history of pancreatic cancer    in her mom, BRCA neg per pt report   Hepatitis C    Opioid dependence ([place])    prescribed Suboxone by her primary doctor   PTSD (post-traumatic stress disorder)     Patient Active Problem List   Diagnosis Date Noted   Constipation in female 08/27/2021   Major depression single episode, in partial remission ([place]) 08/27/2021   Generalized anxiety disorder 08/27/2021   Hepatitis C virus infection without hepatic coma 09/25/2020    Past Surgical History:  Procedure Laterality Date   CESAREAN SECTION      OB History     Gravida  3   Para  1   Term  1   Preterm      AB  2   Living  1      SAB  2   IAB      Ectopic      Multiple      Live Births               Home Medications    Prior to Admission medications   Medication Sig Start Date End Date Taking? Authorizing Provider  buprenorphine (SUBUTEX) 8 MG SUBL SL tablet Place 8 mg under the tongue 2 (two) times daily. 4 MG 04/24/21   [provider]  buPROPion  (WELLBUTRIN SR) 150 MG 12 hr tablet Take 150 mg by mouth 2 (two) times daily. 04/04/21   [provider]  busPIRone (BUSPAR) 15 MG tablet Take 15 mg by mouth in the morning and at bedtime.    [provider]  hydrOXYzine (VISTARIL) 50 MG capsule Take 50 mg by mouth 3 (three) times daily as needed. [employer], Historical, MD  ibuprofen (ADVIL) 600 MG tablet Take 600 mg by mouth as needed.    [provider]  JUNEL FE 1/20 1-20 MG-MCG tablet TAKE 1 TABLET BY MOUTH EVERY DAY 01/26/22   Imagene Riches, CNM  linaclotide [facility]) 145 MCG CAPS capsule TAKE 1 CAPSULE BY MOUTH EVERY DAY BEFORE BREAKFAST 01/09/22   Lucilla Lame, MD  meclizine (ANTIVERT) 25 MG tablet Take 1 tablet (25 mg total) by mouth 3 (three) times daily as needed for dizziness. 08/27/21   Glean Hess, MD  Multiple Vitamin (MULTI-VITAMIN PO) Take by mouth daily.    [provider]  ondansetron (ZOFRAN) 4 MG tablet Take 1 tablet (4 mg total) by mouth every 8 (eight) hours as needed for nausea or vomiting. 12/23/20   Lucilla Lame, MD  pantoprazole (PROTONIX) 40 MG tablet TAKE 1 TABLET BY MOUTH EVERY DAY 12/30/21   Lucilla Lame, MD  sertraline (ZOLOFT) 100 MG tablet Take 200 mg by mouth daily. Dr.   Ellender Hose    [provider]  VYVANSE 30 MG capsule Take 1 capsule (30 mg total) by mouth 2 (two) times daily for 14 days. 02/19/22 03/05/22  Lyndee Hensen, DO    Family History Family History  Problem Relation Age of Onset   Pancreatic cancer Mother 62       passed in 2023   Diabetes Mother    Alcohol abuse Mother    Hyperlipidemia Mother    Hypertension Mother    Tuberculosis Paternal Grandmother    Lung cancer Paternal Grandfather     Social History Social History   Tobacco Use   Smoking status: Former    Packs/day: 0.00    Types: Cigarettes    Quit date: 01/17/2021    Years since quitting: 1.1   Smokeless tobacco: Never  Vaping Use   Vaping Use: Every  day  Substance Use Topics   Alcohol use: Yes    Comment: occ.   Drug use: Yes    Types: Marijuana     Allergies   Suboxone [buprenorphine hcl-naloxone hcl]   Review of Systems Review of Systems :negative unless otherwise stated in HPI.      Physical Exam Triage Vital Signs ED Triage Vitals [02/19/22 1316]  Enc Vitals Group     BP (!) 147/86     Pulse Rate (!) 105     Resp 18     Temp 98.6 F (37 C)     Temp Source Oral     SpO2 100 %     Weight 145 lb (65.8 kg)     Height _0  (1.549 m)     Head Circumference      Peak Flow      Pain Score 0     Pain Loc      Pain Edu?      Excl. in [place]?    No data found.  Updated Vital Signs BP (!) 147/86   Pulse (!) 105   Temp 98.6 F (37 C) (Oral)   Resp 18   Ht _1  (1.549 m)   Wt 65.8 kg   LMP 02/09/2022   SpO2 100%   BMI 27.40 kg/m   Visual Acuity Right Eye Distance:   Left Eye Distance:   Bilateral Distance:    Right Eye Near:   Left Eye Near:    Bilateral Near:     Physical Exam  GEN: well appearing female in no acute distress  CVS: well perfused  RESP: speaking in full sentences without pause, no respiratory distress \ PSYCH: Neatly groomed and appropriately dressed. Maintains good eye contact and is cooperative and attentive. Speech is normal volume and rate. Denies SI/ HI. Normal affect.  UC Treatments / Results  Labs (all labs ordered are listed, but only abnormal results are displayed) Labs Reviewed - No data to display  EKG   Radiology No results found.  Procedures Procedures (including critical care time)  Medications Ordered in UC Medications - No data to display  Initial Impression / Assessment and Plan / UC Course  I have reviewed the triage vital signs and the nursing  notes.  Pertinent labs & imaging results that were available during my care of the patient were reviewed by me and considered in my medical decision making (see chart for details).     Medication refill for  ADHD Patient is a 41 year old female who presents for medication refill.  PDMP reviewed and shows that patient gets Vyvanse 60 tablets monthly.  She takes Vyvanse for ADHD. She has an appointment set up on August 15 with a new psychiatrist at [facility].  Pt provided old bottle of Vyvanse.  It is reasonable to give her a 2-week supply until she gets to her appointment.  This is a one-time prescription.   Discussed MDM, treatment plan and plan for follow-up with patient  who agrees with plan.   Final Clinical Impressions(s) / UC Diagnoses   Final diagnoses:  Attention deficit hyperactivity disorder (ADHD), unspecified ADHD type     Discharge Instructions      This is a one time refill until you get to your new psychiatrist appointment.  Stop by the pharmacy to pick up your prescriptions.    ED Prescriptions     Medication Sig Dispense Auth. Provider   VYVANSE 30 MG capsule Take 1 capsule (30 mg total) by mouth 2 (two) times daily for 14 days. 28 capsule Maddix Heinz, DO      I have reviewed the PDMP during this encounter.   Lyndee Hensen, DO 02/19/22 1344    Lyndee Hensen, DO 02/19/22 [place], Sostenes Kauffmann, DO 02/27/22 0813

## 2022-02-19 NOTE — ED Triage Notes (Signed)
Patient to UC for prescription refill for her Vyvanse, 2x30mg  every morning. Reports that her psychiatrist closed and she was unable to refill her prescription.   Patient has upcoming new patient appointment with new [facility]) .

## 2022-02-19 NOTE — Discharge Instructions (Signed)
This is a one time refill until you get to your new psychiatrist appointment.  Stop by the pharmacy to pick up your prescriptions.

## 2022-03-03 DIAGNOSIS — F32A Depression, unspecified: Secondary | ICD-10-CM | POA: Diagnosis not present

## 2022-03-03 DIAGNOSIS — Z79899 Other long term (current) drug therapy: Secondary | ICD-10-CM | POA: Diagnosis not present

## 2022-03-03 DIAGNOSIS — Z5181 Encounter for therapeutic drug level monitoring: Secondary | ICD-10-CM | POA: Diagnosis not present

## 2022-03-03 DIAGNOSIS — F411 Generalized anxiety disorder: Secondary | ICD-10-CM | POA: Diagnosis not present

## 2022-03-03 DIAGNOSIS — F900 Attention-deficit hyperactivity disorder, predominantly inattentive type: Secondary | ICD-10-CM | POA: Diagnosis not present

## 2022-03-09 DIAGNOSIS — Z79899 Other long term (current) drug therapy: Secondary | ICD-10-CM | POA: Diagnosis not present

## 2022-03-09 DIAGNOSIS — F411 Generalized anxiety disorder: Secondary | ICD-10-CM | POA: Diagnosis not present

## 2022-03-09 DIAGNOSIS — F32A Depression, unspecified: Secondary | ICD-10-CM | POA: Diagnosis not present

## 2022-03-09 DIAGNOSIS — Z5181 Encounter for therapeutic drug level monitoring: Secondary | ICD-10-CM | POA: Diagnosis not present

## 2022-03-09 DIAGNOSIS — F900 Attention-deficit hyperactivity disorder, predominantly inattentive type: Secondary | ICD-10-CM | POA: Diagnosis not present

## 2022-03-16 DIAGNOSIS — F900 Attention-deficit hyperactivity disorder, predominantly inattentive type: Secondary | ICD-10-CM | POA: Diagnosis not present

## 2022-03-16 DIAGNOSIS — F32A Depression, unspecified: Secondary | ICD-10-CM | POA: Diagnosis not present

## 2022-03-16 DIAGNOSIS — Z5181 Encounter for therapeutic drug level monitoring: Secondary | ICD-10-CM | POA: Diagnosis not present

## 2022-03-16 DIAGNOSIS — F411 Generalized anxiety disorder: Secondary | ICD-10-CM | POA: Diagnosis not present

## 2022-03-17 DIAGNOSIS — Z79899 Other long term (current) drug therapy: Secondary | ICD-10-CM | POA: Diagnosis not present

## 2022-03-20 DIAGNOSIS — Z419 Encounter for procedure for purposes other than remedying health state, unspecified: Secondary | ICD-10-CM | POA: Diagnosis not present

## 2022-03-30 DIAGNOSIS — F32A Depression, unspecified: Secondary | ICD-10-CM | POA: Diagnosis not present

## 2022-03-30 DIAGNOSIS — F411 Generalized anxiety disorder: Secondary | ICD-10-CM | POA: Diagnosis not present

## 2022-03-30 DIAGNOSIS — F900 Attention-deficit hyperactivity disorder, predominantly inattentive type: Secondary | ICD-10-CM | POA: Diagnosis not present

## 2022-03-30 DIAGNOSIS — Z5181 Encounter for therapeutic drug level monitoring: Secondary | ICD-10-CM | POA: Diagnosis not present

## 2022-03-31 DIAGNOSIS — Z79899 Other long term (current) drug therapy: Secondary | ICD-10-CM | POA: Diagnosis not present

## 2022-04-13 DIAGNOSIS — Z5181 Encounter for therapeutic drug level monitoring: Secondary | ICD-10-CM | POA: Diagnosis not present

## 2022-04-14 DIAGNOSIS — Z79899 Other long term (current) drug therapy: Secondary | ICD-10-CM | POA: Diagnosis not present

## 2022-04-19 ENCOUNTER — Other Ambulatory Visit: Payer: Self-pay | Admitting: Gastroenterology

## 2022-04-19 DIAGNOSIS — Z419 Encounter for procedure for purposes other than remedying health state, unspecified: Secondary | ICD-10-CM | POA: Diagnosis not present

## 2022-04-27 DIAGNOSIS — F900 Attention-deficit hyperactivity disorder, predominantly inattentive type: Secondary | ICD-10-CM | POA: Diagnosis not present

## 2022-04-27 DIAGNOSIS — Z5181 Encounter for therapeutic drug level monitoring: Secondary | ICD-10-CM | POA: Diagnosis not present

## 2022-04-27 DIAGNOSIS — F411 Generalized anxiety disorder: Secondary | ICD-10-CM | POA: Diagnosis not present

## 2022-04-27 DIAGNOSIS — Z79899 Other long term (current) drug therapy: Secondary | ICD-10-CM | POA: Diagnosis not present

## 2022-04-27 DIAGNOSIS — F32A Depression, unspecified: Secondary | ICD-10-CM | POA: Diagnosis not present

## 2022-05-07 DIAGNOSIS — Z79899 Other long term (current) drug therapy: Secondary | ICD-10-CM | POA: Diagnosis not present

## 2022-05-07 DIAGNOSIS — Z5181 Encounter for therapeutic drug level monitoring: Secondary | ICD-10-CM | POA: Diagnosis not present

## 2022-05-18 DIAGNOSIS — F411 Generalized anxiety disorder: Secondary | ICD-10-CM | POA: Diagnosis not present

## 2022-05-18 DIAGNOSIS — F32A Depression, unspecified: Secondary | ICD-10-CM | POA: Diagnosis not present

## 2022-05-18 DIAGNOSIS — F900 Attention-deficit hyperactivity disorder, predominantly inattentive type: Secondary | ICD-10-CM | POA: Diagnosis not present

## 2022-05-20 DIAGNOSIS — Z5181 Encounter for therapeutic drug level monitoring: Secondary | ICD-10-CM | POA: Diagnosis not present

## 2022-05-20 DIAGNOSIS — Z79899 Other long term (current) drug therapy: Secondary | ICD-10-CM | POA: Diagnosis not present

## 2022-05-20 DIAGNOSIS — Z419 Encounter for procedure for purposes other than remedying health state, unspecified: Secondary | ICD-10-CM | POA: Diagnosis not present

## 2022-06-04 DIAGNOSIS — Z79899 Other long term (current) drug therapy: Secondary | ICD-10-CM | POA: Diagnosis not present

## 2022-06-04 DIAGNOSIS — Z5181 Encounter for therapeutic drug level monitoring: Secondary | ICD-10-CM | POA: Diagnosis not present

## 2022-06-08 DIAGNOSIS — F900 Attention-deficit hyperactivity disorder, predominantly inattentive type: Secondary | ICD-10-CM | POA: Diagnosis not present

## 2022-06-08 DIAGNOSIS — F411 Generalized anxiety disorder: Secondary | ICD-10-CM | POA: Diagnosis not present

## 2022-06-08 DIAGNOSIS — F32A Depression, unspecified: Secondary | ICD-10-CM | POA: Diagnosis not present

## 2022-06-08 MED ORDER — LINACLOTIDE 290 MCG PO CAPS: 290.0000 ug | ORAL_CAPSULE | Freq: Every day | ORAL | 1 refills | Status: DC

## 2022-06-08 NOTE — Addendum Note (Signed)
Addended by: Roena Malady on: 06/08/2022 12:35 PM   Modules accepted: Orders

## 2022-06-08 NOTE — Addendum Note (Signed)
Addended by: Roena Malady on: 06/08/2022 12:34 PM   Modules accepted: Orders

## 2022-06-19 DIAGNOSIS — Z419 Encounter for procedure for purposes other than remedying health state, unspecified: Secondary | ICD-10-CM | POA: Diagnosis not present

## 2022-06-23 ENCOUNTER — Other Ambulatory Visit: Payer: Self-pay | Admitting: Gastroenterology

## 2022-07-02 ENCOUNTER — Other Ambulatory Visit: Payer: Self-pay | Admitting: Gastroenterology

## 2022-07-02 DIAGNOSIS — Z5181 Encounter for therapeutic drug level monitoring: Secondary | ICD-10-CM | POA: Diagnosis not present

## 2022-07-02 DIAGNOSIS — Z79899 Other long term (current) drug therapy: Secondary | ICD-10-CM | POA: Diagnosis not present

## 2022-07-06 DIAGNOSIS — F900 Attention-deficit hyperactivity disorder, predominantly inattentive type: Secondary | ICD-10-CM | POA: Diagnosis not present

## 2022-07-06 DIAGNOSIS — F411 Generalized anxiety disorder: Secondary | ICD-10-CM | POA: Diagnosis not present

## 2022-07-06 DIAGNOSIS — F32A Depression, unspecified: Secondary | ICD-10-CM | POA: Diagnosis not present

## 2022-07-06 IMAGING — CT CT RENAL STONE PROTOCOL
3 of 4 series · 10 of 46 positions shown, 15 images · non-contrast
Comparison: None.

CLINICAL DATA: Left flank pain with kidney stone suspected

EXAM:
CT ABDOMEN AND PELVIS WITHOUT CONTRAST
TECHNIQUE: Multidetector CT imaging of the abdomen and pelvis was performed
following the standard protocol without IV contrast.

[Series 4: lung bases · axial · 0.69mm/px · z∈[-805,-695]mm · 6 of 32 slices shown, 11 images]
[im 5/32  soft-tissue]
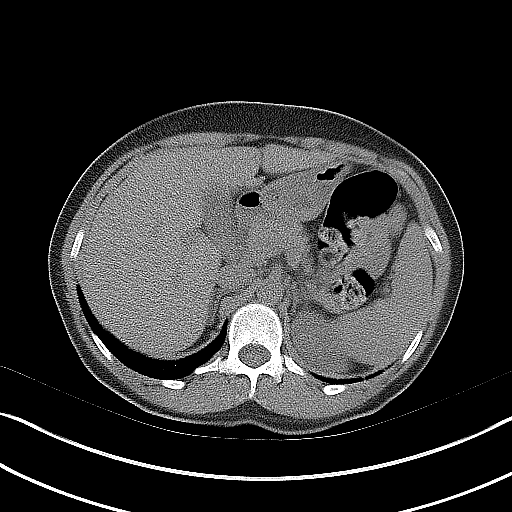
[im 5/32  bone]
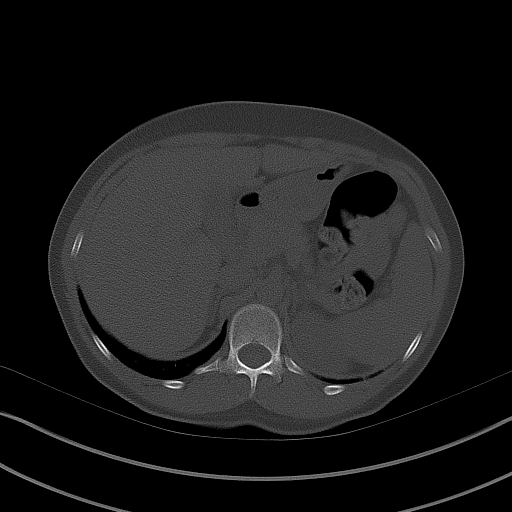
[im 9/32  soft-tissue]
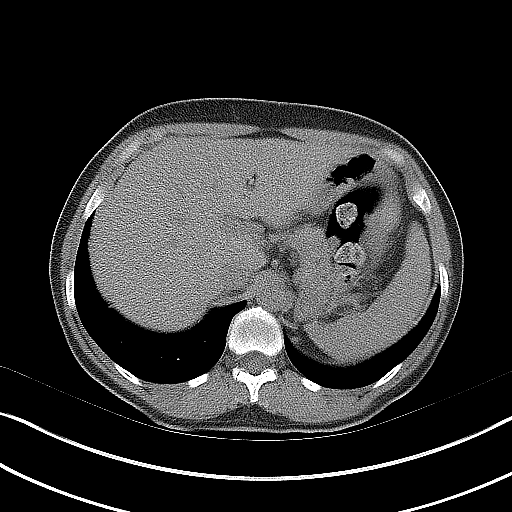
[im 14/32  soft-tissue]
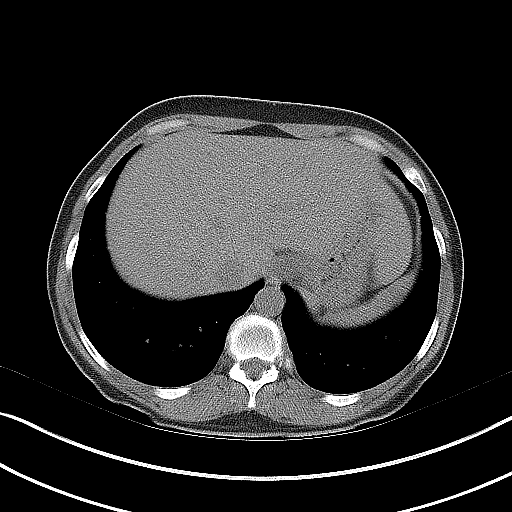
[im 14/32  lung]
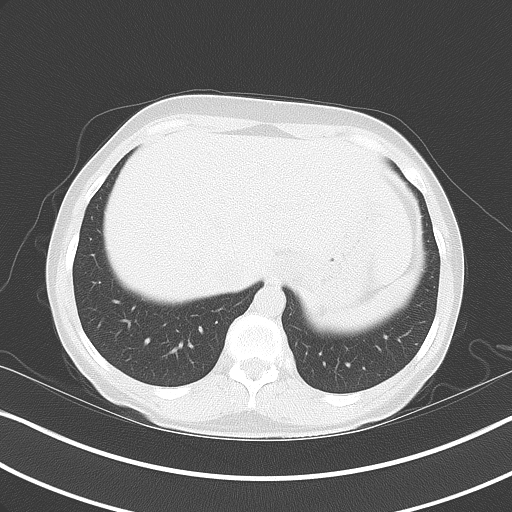
[im 18/32  soft-tissue]
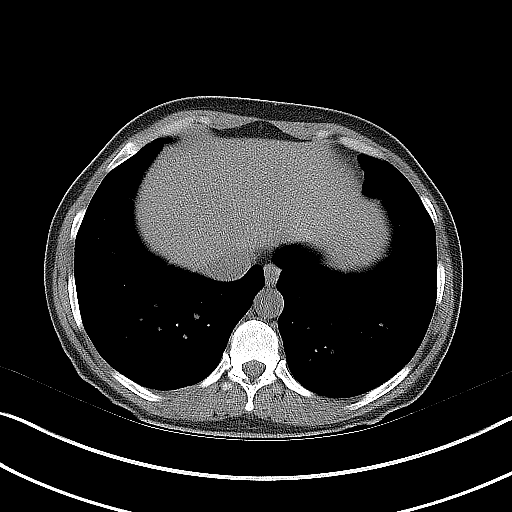
[im 18/32  lung]
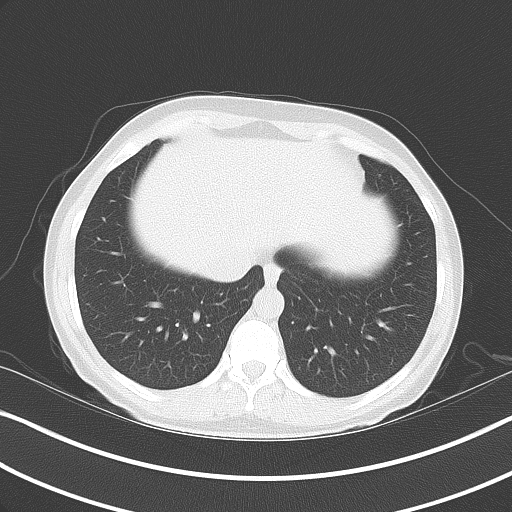
[im 23/32  soft-tissue]
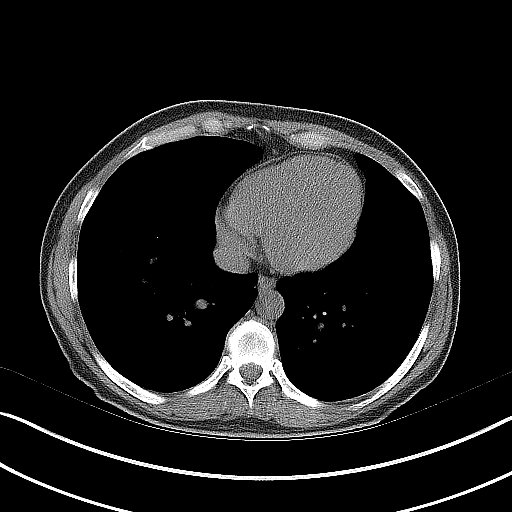
[im 23/32  lung]
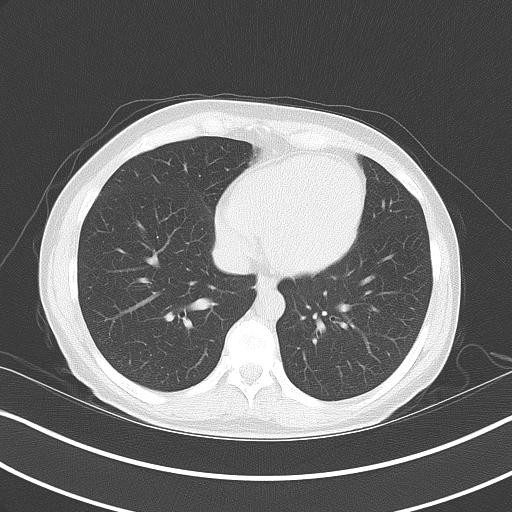
[im 27/32  soft-tissue]
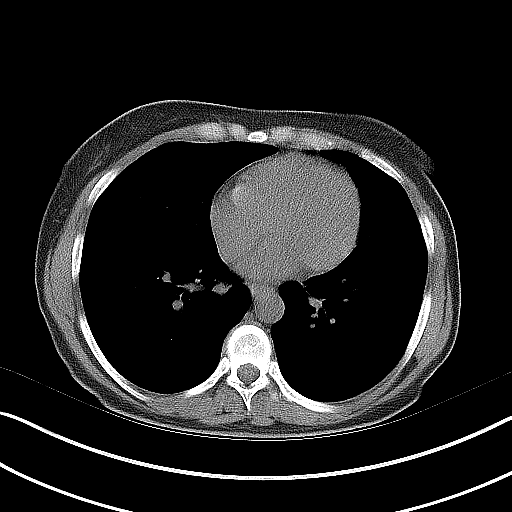
[im 27/32  lung]
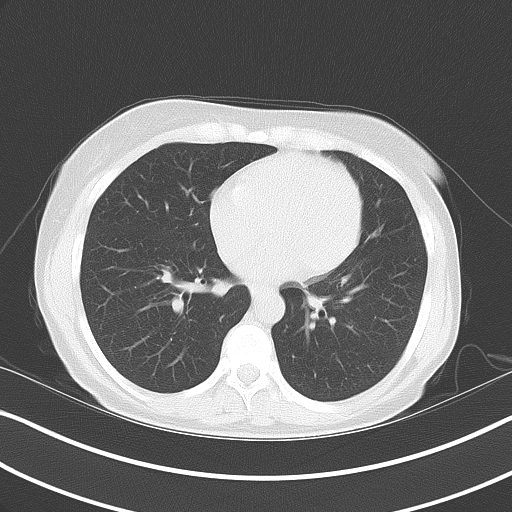

[Series 5: coronal · coronal · 0.74mm/px · 3 of 123 slices shown]
[im 41/123  soft-tissue]
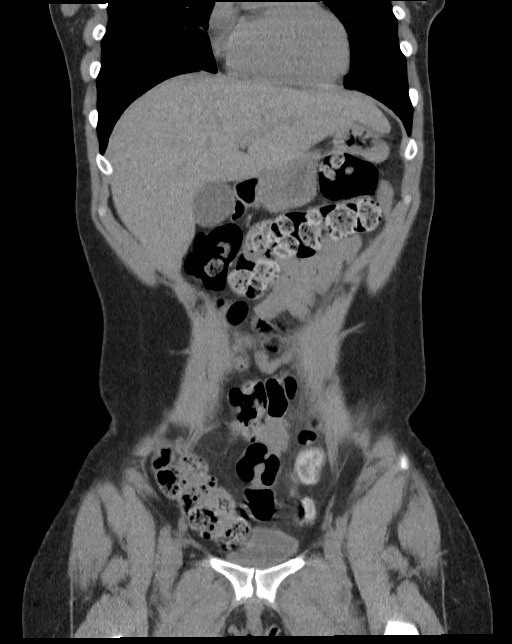
[im 55/123  soft-tissue]
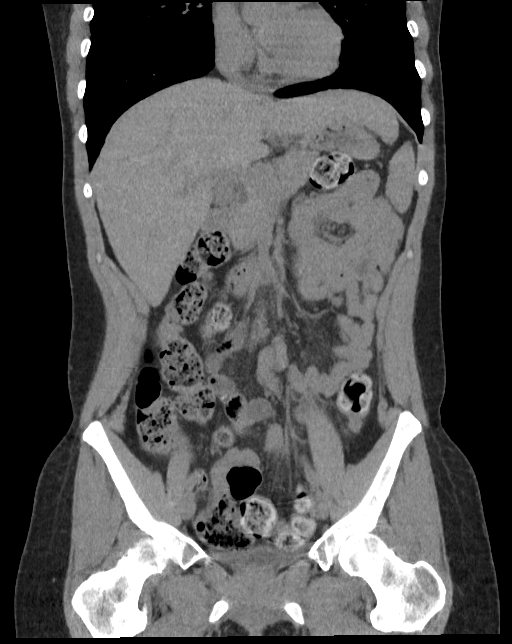
[im 68/123  soft-tissue]
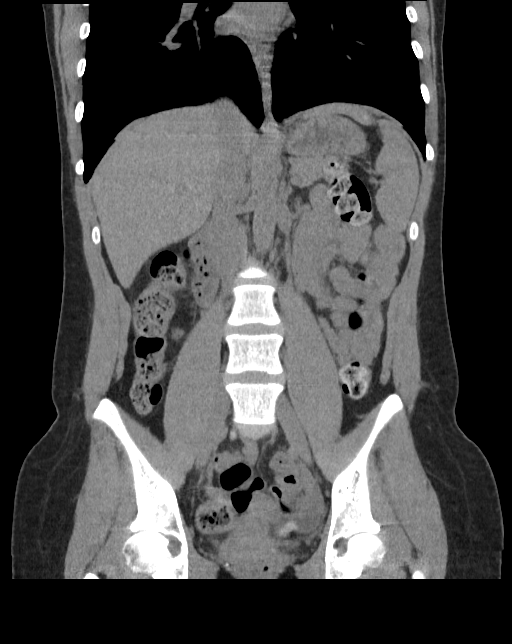

[Series 6: sagittal · sagittal · 0.48mm/px · 1 of 173 slices shown]
[im 58/173  soft-tissue]
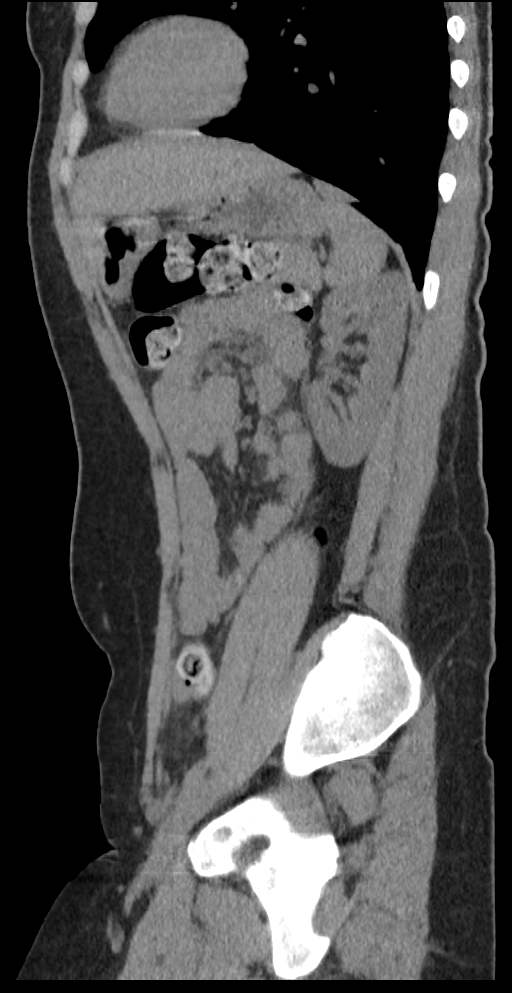

[10 of 46 positions shown; findings below may reference images not displayed]

FINDINGS: Lower chest:  No contributory findings.

Hepatobiliary: No focal liver abnormality.No evidence of biliary
obstruction or stone.

Pancreas: Unremarkable.

Spleen: Unremarkable.

Adrenals/Urinary Tract: Negative adrenals. No hydronephrosis or
visible ureteral calculus. A Punctate right renal calculus is likely
based on coronal reformats. Unremarkable bladder.

Stomach/Bowel: No obstruction. No appendicitis. Desiccated stool
throughout much of the colon.

Vascular/Lymphatic: No acute vascular abnormality. No mass or
adenopathy.

Reproductive:High-density at the dependent right ovary which could
be mineralization or blood products. No ovarian enlargement and this
is contralateral to the side of symptoms.

Other: No ascites or pneumoperitoneum.

Musculoskeletal: No acute abnormalities.
IMPRESSION: 1. No hydronephrosis or ureteral calculus.
2. Desiccated stool in much of the colon.
3. Calcification versus is blood products at the nonenlarged right
ovary, contralateral to the side of reported symptoms.
4. Probable punctate right renal calculus.

## 2022-07-20 DIAGNOSIS — Z419 Encounter for procedure for purposes other than remedying health state, unspecified: Secondary | ICD-10-CM | POA: Diagnosis not present

## 2022-07-21 ENCOUNTER — Other Ambulatory Visit: Payer: Self-pay | Admitting: Gastroenterology

## 2022-07-22 ENCOUNTER — Other Ambulatory Visit: Payer: Self-pay | Admitting: Internal Medicine

## 2022-07-23 ENCOUNTER — Encounter: Payer: Self-pay | Admitting: Internal Medicine

## 2022-07-23 ENCOUNTER — Ambulatory Visit (INDEPENDENT_AMBULATORY_CARE_PROVIDER_SITE_OTHER): Payer: Medicaid Other | Admitting: Internal Medicine

## 2022-07-23 VITALS — BP 128/68 | HR 82 | Ht 61.0 in | Wt 163.0 lb

## 2022-07-23 DIAGNOSIS — Z8619 Personal history of other infectious and parasitic diseases: Secondary | ICD-10-CM

## 2022-07-23 DIAGNOSIS — Z131 Encounter for screening for diabetes mellitus: Secondary | ICD-10-CM | POA: Diagnosis not present

## 2022-07-23 DIAGNOSIS — Z13220 Encounter for screening for lipoid disorders: Secondary | ICD-10-CM | POA: Diagnosis not present

## 2022-07-23 DIAGNOSIS — Z0000 Encounter for general adult medical examination without abnormal findings: Secondary | ICD-10-CM

## 2022-07-23 DIAGNOSIS — Z1231 Encounter for screening mammogram for malignant neoplasm of breast: Secondary | ICD-10-CM

## 2022-07-23 DIAGNOSIS — F331 Major depressive disorder, recurrent, moderate: Secondary | ICD-10-CM

## 2022-07-23 NOTE — Patient Instructions (Signed)
Call [facility] to schedule your mammogram at [phone].  

## 2022-07-23 NOTE — Progress Notes (Signed)
Date:  07/23/2022   Name:  Lori Lawson   DOB:  06-May-1981   MRN:  [id number]   Chief Complaint: Annual Exam (Breast exam no pap ) Lori Lawson is a 42 y.o. female who presents today for her Complete Annual Exam. She feels well. She reports exercising walking daily. She reports she is sleeping well. Breast complaints none.  Mammogram: none DEXA: none Pap smear: 11/2020 neg/neg Colonoscopy: none  Health Maintenance Due  Topic Date Due   Pneumococcal Vaccine 30-52 Years old (1 - PCV) Never done   DTaP/Tdap/Td (1 - Tdap) Never done    Immunization History  Administered Date(s) Administered   Hep A / Hep B 12/09/2020, 01/15/2021    Depression        This is a chronic (treated by Psych) problem.  The problem has been gradually improving (she has a new psych and is being referred to a therapist) since onset.  Associated symptoms include no fatigue and no headaches. Gastroesophageal Reflux She complains of heartburn. She reports no abdominal pain, no chest pain, no coughing or no wheezing. This is a recurrent problem. The problem occurs occasionally. The problem has been gradually improving. The heartburn does not wake her from sleep. The heartburn does not limit her activity. Pertinent negatives include no fatigue. She has tried a PPI for the symptoms.  Hep C - completed treatment in 2022.  Overdue for repeat viral cytology to document sustained cure.  Lab Results  Component Value Date   NA 135 09/25/2020   K 4.5 09/25/2020   CO2 22 09/25/2020   GLUCOSE 84 09/25/2020   BUN 13 09/25/2020   CREATININE 0.81 09/25/2020   CALCIUM 9.7 09/25/2020   EGFR 95 09/25/2020   GFRNONAA >60 06/17/2020   No results found for: "CHOL", "HDL", "LDLCALC", "LDLDIRECT", "TRIG", "CHOLHDL" Lab Results  Component Value Date   TSH 1.680 09/25/2020   No results found for: "HGBA1C" Lab Results  Component Value Date   WBC 4.8 09/25/2020   HGB 13.2 09/25/2020   HCT 40.1 09/25/2020   MCV 87  09/25/2020   PLT 355 09/25/2020   Lab Results  Component Value Date   ALT 13 05/07/2021   AST 23 05/07/2021   ALKPHOS 88 05/07/2021   BILITOT 0.4 05/07/2021   No results found for: "25OHVITD2", "25OHVITD3", "VD25OH"   Review of Systems  Constitutional:  Negative for chills, fatigue and fever.  HENT:  Negative for congestion, hearing loss, tinnitus, trouble swallowing and voice change.   Eyes:  Negative for visual disturbance.  Respiratory:  Negative for cough, chest tightness, shortness of breath and wheezing.   Cardiovascular:  Negative for chest pain, palpitations and leg swelling.  Gastrointestinal:  Positive for heartburn. Negative for abdominal pain, constipation, diarrhea and vomiting.  Endocrine: Negative for polydipsia and polyuria.  Genitourinary:  Negative for dysuria, frequency, genital sores, vaginal bleeding and vaginal discharge.  Musculoskeletal:  Negative for arthralgias, gait problem and joint swelling.  Skin:  Negative for color change and rash.  Neurological:  Negative for dizziness, tremors, light-headedness and headaches.  Hematological:  Negative for adenopathy. Does not bruise/bleed easily.  Psychiatric/Behavioral:  Positive for depression. Negative for dysphoric mood and sleep disturbance. The patient is not nervous/anxious.     Patient Active Problem List   Diagnosis Date Noted   Constipation in female 08/27/2021   Major depression single episode, in partial remission ([place]) 08/27/2021   Generalized anxiety disorder 08/27/2021   Hepatitis C virus infection without hepatic coma  09/25/2020    Allergies  Allergen Reactions   Suboxone [Buprenorphine Hcl-Naloxone Hcl] Anaphylaxis and Nausea And Vomiting    Past Surgical History:  Procedure Laterality Date   CESAREAN SECTION      Social History   Tobacco Use   Smoking status: Former    Packs/day: 0.00    Types: Cigarettes    Quit date: 01/17/2021    Years since quitting: 1.5   Smokeless tobacco:  Never  Vaping Use   Vaping Use: Every day  Substance Use Topics   Alcohol use: Yes    Comment: occ.   Drug use: Yes    Types: Marijuana     Medication list has been reviewed and updated.  Current Meds  Medication Sig   busPIRone (BUSPAR) 15 MG tablet Take 15 mg by mouth in the morning and at bedtime.   hydrOXYzine (VISTARIL) 50 MG capsule Take 50 mg by mouth 3 (three) times daily as needed. [facility]   ibuprofen (ADVIL) 600 MG tablet Take 600 mg by mouth as needed.   JUNEL FE 1/20 1-20 MG-MCG tablet TAKE 1 TABLET BY MOUTH EVERY DAY   linaclotide (LINZESS) 290 MCG CAPS capsule Take 1 capsule (290 mcg total) by mouth daily before breakfast.   meclizine (ANTIVERT) 25 MG tablet Take 1 tablet (25 mg total) by mouth 3 (three) times daily as needed for dizziness.   Multiple Vitamin (MULTI-VITAMIN PO) Take by mouth daily.   OLANZapine (ZYPREXA) 7.5 MG tablet Take 7.5 mg by mouth at bedtime.   ondansetron (ZOFRAN) 4 MG tablet Take 1 tablet (4 mg total) by mouth every 8 (eight) hours as needed for nausea or vomiting.   pantoprazole (PROTONIX) 40 MG tablet TAKE 1 TABLET (40 MG TOTAL) BY MOUTH DAILY. MUST SCHEDULE OFFICE VISIT   sertraline (ZOLOFT) 100 MG tablet Take 200 mg by mouth daily. Dr.   Ellender Hose   SUBLOCADE 300 MG/1.5ML SOSY Inject into the skin.   VYVANSE 60 MG capsule Take 60 mg by mouth every morning.   [DISCONTINUED] buPROPion (WELLBUTRIN SR) 150 MG 12 hr tablet Take 150 mg by mouth 2 (two) times daily.       07/23/2022   11:14 AM 08/27/2021    1:59 PM 09/25/2020    3:09 PM  GAD 7 : Generalized Anxiety Score  Nervous, Anxious, on Edge _0 Control/stop worrying _1 Worry too much - different things 1 0 3  Trouble relaxing 1 0 3  Restless _2 Easily annoyed or irritable _3 Afraid - awful might happen 0 0 1  Total GAD 7 Score _4 Anxiety Difficulty Somewhat difficult         07/23/2022   11:13 AM 08/27/2021    1:58 PM 09/25/2020    3:09 PM   Depression screen PHQ 2/9  Decreased Interest 0 1 1  Down, Depressed, Hopeless 0 0 1  PHQ - 2 Score 0 1 2  Altered sleeping _5 Tired, decreased energy _6 Change in appetite 0 0 0  Feeling bad or failure about yourself  0 0 0  Trouble concentrating _7 Moving slowly or fidgety/restless 2 0 3  Suicidal thoughts 0 0 0  PHQ-9 Score _8 Difficult doing work/chores Somewhat difficult Somewhat difficult     BP Readings from Last 3 Encounters:  07/23/22 128/68  02/19/22 (!) 147/86  08/27/21 120/85  Physical Exam Vitals and nursing note reviewed.  Constitutional:      General: She is not in acute distress.    Appearance: She is well-developed.  HENT:     Head: Normocephalic and atraumatic.     Right Ear: Tympanic membrane and ear canal normal.     Left Ear: Tympanic membrane and ear canal normal.     Nose:     Right Sinus: No maxillary sinus tenderness.     Left Sinus: No maxillary sinus tenderness.  Eyes:     General: No scleral icterus.       Right eye: No discharge.        Left eye: No discharge.     Conjunctiva/sclera: Conjunctivae normal.  Neck:     Thyroid: No thyromegaly.     Vascular: No carotid bruit.  Cardiovascular:     Rate and Rhythm: Normal rate and regular rhythm.     Pulses: Normal pulses.     Heart sounds: Normal heart sounds.  Pulmonary:     Effort: Pulmonary effort is normal. No respiratory distress.     Breath sounds: No wheezing.  Chest:  Breasts:    Right: No mass, nipple discharge, skin change or tenderness.     Left: No mass, nipple discharge, skin change or tenderness.  Abdominal:     General: Bowel sounds are normal.     Palpations: Abdomen is soft.     Tenderness: There is no abdominal tenderness.  Musculoskeletal:     Cervical back: Normal range of motion. No erythema.     Right lower leg: No edema.     Left lower leg: No edema.  Lymphadenopathy:     Cervical: No cervical adenopathy.  Skin:    General: Skin is warm  and dry.     Findings: No rash.  Neurological:     Mental Status: She is alert and oriented to person, place, and time.     Cranial Nerves: No cranial nerve deficit.     Sensory: No sensory deficit.     Deep Tendon Reflexes: Reflexes are normal and symmetric.  Psychiatric:        Attention and Perception: Attention normal.        Mood and Affect: Mood normal.     Wt Readings from Last 3 Encounters:  07/23/22 163 lb (73.9 kg)  02/19/22 145 lb (65.8 kg)  08/27/21 141 lb (64 kg)    BP 128/68 (BP Location: Left Arm, Cuff Size: Normal)   Pulse 82   Ht _0  (1.549 m)   Wt 163 lb (73.9 kg)   LMP 07/16/2022 (Exact Date) Comment: spotting  SpO2 95%   BMI 30.80 kg/m   Assessment and Plan: Problem List Items Addressed This Visit   None Visit Diagnoses     Annual physical exam    -  Primary   up to date on screenings continue healthy diet, exercise Will need Tdap if injured   Relevant Orders   Comprehensive metabolic panel   Hemoglobin A1c   Lipid panel   Encounter for screening mammogram for breast cancer       recommend screening starting at age 94   Relevant Orders   MM 3D SCREEN BREAST BILATERAL   Screening for lipid disorders       Relevant Orders   Lipid panel   History of hepatitis C       completed treatment 2022   Relevant Orders   HCV RNA quant   Screening  for diabetes mellitus       Relevant Orders   Hemoglobin A1c   Moderate episode of recurrent major depressive disorder ([place])   (Chronic)     Doing well with current therapy planning to start seeing a counselor as well        Partially dictated using Editor, commissioning. Any errors are unintentional.  Halina Maidens, MD [facility]  07/23/2022

## 2022-07-24 LAB — COMPREHENSIVE METABOLIC PANEL
ALT: 14 IU/L (ref 0–32)
AST: 25 IU/L (ref 0–40)
Albumin/Globulin Ratio: 1.7 (ref 1.2–2.2)
Albumin: 4.5 g/dL (ref 3.9–4.9)
Alkaline Phosphatase: 118 IU/L (ref 44–121)
BUN/Creatinine Ratio: 20 (ref 9–23)
BUN: 17 mg/dL (ref 6–24)
Bilirubin Total: 0.2 mg/dL (ref 0.0–1.2)
CO2: 24 mmol/L (ref 20–29)
Calcium: 9.1 mg/dL (ref 8.7–10.2)
Chloride: 100 mmol/L (ref 96–106)
Creatinine, Ser: 0.86 mg/dL (ref 0.57–1.00)
Globulin, Total: 2.7 g/dL (ref 1.5–4.5)
Glucose: 88 mg/dL (ref 70–99)
Potassium: 4.4 mmol/L (ref 3.5–5.2)
Sodium: 137 mmol/L (ref 134–144)
Total Protein: 7.2 g/dL (ref 6.0–8.5)
eGFR: 87 mL/min/{1.73_m2} (ref >59)

## 2022-07-24 LAB — LIPID PANEL
Chol/HDL Ratio: 4.5 ratio — ABNORMAL HIGH (ref 0.0–4.4)
Cholesterol, Total: 202 mg/dL — ABNORMAL HIGH (ref 100–199)
HDL: 45 mg/dL (ref >39)
LDL Chol Calc (NIH): 112 mg/dL — ABNORMAL HIGH (ref 0–99)
Triglycerides: 258 mg/dL — ABNORMAL HIGH (ref 0–149)
VLDL Cholesterol Cal: 45 mg/dL — ABNORMAL HIGH (ref 5–40)

## 2022-07-24 LAB — HEMOGLOBIN A1C
Est. average glucose Bld gHb Est-mCnc: 108 mg/dL
Hgb A1c MFr Bld: 5.4 % (ref 4.8–5.6)

## 2022-07-24 LAB — HCV RNA QUANT: Hepatitis C Quantitation: NOT DETECTED IU/mL

## 2022-07-30 DIAGNOSIS — Z5181 Encounter for therapeutic drug level monitoring: Secondary | ICD-10-CM | POA: Diagnosis not present

## 2022-07-31 DIAGNOSIS — F331 Major depressive disorder, recurrent, moderate: Secondary | ICD-10-CM | POA: Diagnosis not present

## 2022-08-10 DIAGNOSIS — F411 Generalized anxiety disorder: Secondary | ICD-10-CM | POA: Diagnosis not present

## 2022-08-10 DIAGNOSIS — F900 Attention-deficit hyperactivity disorder, predominantly inattentive type: Secondary | ICD-10-CM | POA: Diagnosis not present

## 2022-08-10 DIAGNOSIS — F32A Depression, unspecified: Secondary | ICD-10-CM | POA: Diagnosis not present

## 2022-08-11 ENCOUNTER — Other Ambulatory Visit: Payer: Self-pay | Admitting: Gastroenterology

## 2022-08-12 ENCOUNTER — Other Ambulatory Visit: Payer: Self-pay | Admitting: Gastroenterology

## 2022-08-15 ENCOUNTER — Other Ambulatory Visit: Payer: Self-pay | Admitting: Obstetrics

## 2022-08-15 DIAGNOSIS — Z30011 Encounter for initial prescription of contraceptive pills: Secondary | ICD-10-CM

## 2022-08-16 ENCOUNTER — Other Ambulatory Visit: Payer: Self-pay | Admitting: Gastroenterology

## 2022-08-17 ENCOUNTER — Other Ambulatory Visit: Payer: Self-pay | Admitting: Gastroenterology

## 2022-08-19 ENCOUNTER — Other Ambulatory Visit: Payer: Self-pay | Admitting: Gastroenterology

## 2022-08-20 DIAGNOSIS — Z419 Encounter for procedure for purposes other than remedying health state, unspecified: Secondary | ICD-10-CM | POA: Diagnosis not present

## 2022-08-26 ENCOUNTER — Other Ambulatory Visit: Payer: Self-pay | Admitting: Gastroenterology

## 2022-08-27 ENCOUNTER — Other Ambulatory Visit: Payer: Self-pay | Admitting: Gastroenterology

## 2022-08-27 DIAGNOSIS — Z5181 Encounter for therapeutic drug level monitoring: Secondary | ICD-10-CM | POA: Diagnosis not present

## 2022-08-29 ENCOUNTER — Telehealth: Payer: Medicaid Other | Admitting: Physician Assistant

## 2022-08-29 DIAGNOSIS — N390 Urinary tract infection, site not specified: Secondary | ICD-10-CM | POA: Diagnosis not present

## 2022-08-29 MED ORDER — NITROFURANTOIN MONOHYD MACRO 100 MG PO CAPS: 100.0000 mg | ORAL_CAPSULE | Freq: Two times a day (BID) | ORAL | 0 refills | Status: AC

## 2022-08-29 NOTE — Progress Notes (Signed)
Virtual Visit Consent   Lori Lawson, you are scheduled for a virtual visit with a [facility] provider today. Just as with appointments in the office, your consent must be obtained to participate. Your consent will be active for this visit and any virtual visit you may have with one of our providers in the next 365 days. If you have a MyChart account, a copy of this consent can be sent to you electronically.  As this is a virtual visit, video technology does not allow for your provider to perform a traditional examination. This may limit your provider's ability to fully assess your condition. If your provider identifies any concerns that need to be evaluated in person or the need to arrange testing (such as labs, EKG, etc.), we will make arrangements to do so. Although advances in technology are sophisticated, we cannot ensure that it will always work on either your end or our end. If the connection with a video visit is poor, the visit may have to be switched to a telephone visit. With either a video or telephone visit, we are not always able to ensure that we have a secure connection.  By engaging in this virtual visit, you consent to the provision of healthcare and authorize for your insurance to be billed (if applicable) for the services provided during this visit. Depending on your insurance coverage, you may receive a charge related to this service.  I need to obtain your verbal consent now. Are you willing to proceed with your visit today? Lori Lawson has provided verbal consent on 08/29/2022 for a virtual visit (video or telephone). Lenise Arena Ward, PA-C  Date: 08/29/2022 3:07 PM  Virtual Visit via Video Note   I, Lenise Arena Ward, connected with  Lori Lawson  ([id number], 1980/08/11) on 08/29/22 at  3:00 PM EST by a video-enabled telemedicine application and verified that I am speaking with the correct person using two identifiers.  Location: Patient: Virtual Visit Location Patient:  Home Provider: Virtual Visit Location Provider: Home Office   I discussed the limitations of evaluation and management by telemedicine and the availability of in person appointments. The patient expressed understanding and agreed to proceed.    History of Present Illness: Lori Lawson is a 42 y.o. who identifies as a female who was assigned female at birth, and is being seen today for UTI sx.  Reports she started experiencing dysuria that started yesterday.  She denies abdominal pain, fever, chills, n/v/d, flank pain.  She has been taking otc UTI medication with minimal relief.  Reports taking a home UTI test which was positive.   HPI: HPI  Problems:  Patient Active Problem List   Diagnosis Date Noted   Constipation in female 08/27/2021   Major depression single episode, in partial remission ([facility]) 08/27/2021   Generalized anxiety disorder 08/27/2021   Hepatitis C virus infection without hepatic coma 09/25/2020    Allergies:  Allergies  Allergen Reactions   Suboxone [Buprenorphine Hcl-Naloxone Hcl] Anaphylaxis and Nausea And Vomiting   Medications:  Current Outpatient Medications:    nitrofurantoin, macrocrystal-monohydrate, (MACROBID) 100 MG capsule, Take 1 capsule (100 mg total) by mouth 2 (two) times daily for 5 days., Disp: 10 capsule, Rfl: 0   busPIRone (BUSPAR) 15 MG tablet, Take 15 mg by mouth in the morning and at bedtime., Disp: , Rfl:    hydrOXYzine (VISTARIL) 50 MG capsule, Take 50 mg by mouth 3 (three) times daily as needed. [employer], Disp: , Rfl:  ibuprofen (ADVIL) 600 MG tablet, Take 600 mg by mouth as needed., Disp: , Rfl:    JUNEL FE 1/20 1-20 MG-MCG tablet, TAKE 1 TABLET BY MOUTH EVERY DAY, Disp: 84 tablet, Rfl: 1   linaclotide (LINZESS) 290 MCG CAPS capsule, TAKE 1 CAPSULE BY MOUTH DAILY BEFORE BREAKFAST., Disp: 30 capsule, Rfl: 0   meclizine (ANTIVERT) 25 MG tablet, Take 1 tablet (25 mg total) by mouth 3 (three) times daily as needed for dizziness.,  Disp: 20 tablet, Rfl: 0   Multiple Vitamin (MULTI-VITAMIN PO), Take by mouth daily., Disp: , Rfl:    OLANZapine (ZYPREXA) 7.5 MG tablet, Take 7.5 mg by mouth at bedtime., Disp: , Rfl:    ondansetron (ZOFRAN) 4 MG tablet, Take 1 tablet (4 mg total) by mouth every 8 (eight) hours as needed for nausea or vomiting., Disp: 20 tablet, Rfl: 1   pantoprazole (PROTONIX) 40 MG tablet, TAKE 1 TABLET (40 MG TOTAL) BY MOUTH DAILY. MUST SCHEDULE OFFICE VISIT, Disp: 30 tablet, Rfl: 0   sertraline (ZOLOFT) 100 MG tablet, Take 200 mg by mouth daily. Dr.   Ellender Hose, Disp: , Rfl:    SUBLOCADE 300 MG/1.5ML SOSY, Inject into the skin., Disp: , Rfl:    VYVANSE 60 MG capsule, Take 60 mg by mouth every morning., Disp: , Rfl:   Observations/Objective: Patient is well-developed, well-nourished in no acute distress.  Resting comfortably at home.  Head is normocephalic, atraumatic.  No labored breathing.  Speech is clear and coherent with logical content.  Patient is alert and oriented at baseline.    Assessment and Plan: 1. Urinary tract infection without hematuria, site unspecified - nitrofurantoin, macrocrystal-monohydrate, (MACROBID) 100 MG capsule; Take 1 capsule (100 mg total) by mouth 2 (two) times daily for 5 days.  Dispense: 10 capsule; Refill: 0  Advised in person evaluation with a UA if no improvement.   Follow Up Instructions: I discussed the assessment and treatment plan with the patient. The patient was provided an opportunity to ask questions and all were answered. The patient agreed with the plan and demonstrated an understanding of the instructions.  A copy of instructions were sent to the patient via MyChart unless otherwise noted below.     The patient was advised to call back or seek an in-person evaluation if the symptoms worsen or if the condition fails to improve as anticipated.  Time:  I spent 5 minutes with the patient via telehealth technology discussing the above problems/concerns.     Lenise Arena Ward, PA-C

## 2022-08-29 NOTE — Patient Instructions (Signed)
Lori Lawson, thank you for joining [place], PA-C for today's virtual visit.  While this provider is not your primary care provider (PCP), if your PCP is located in our provider database this encounter information will be shared with them immediately following your visit.   [facility] account gives you access to today's visit and all your visits, tests, and labs performed at [facility] " click here if you don't have a Lori [facility] account or go to [email or web address]  Consent: (Patient) Lori Lawson provided verbal consent for this virtual visit at the beginning of the encounter.  Current Medications:  Current Outpatient Medications:    nitrofurantoin, macrocrystal-monohydrate, (MACROBID) 100 MG capsule, Take 1 capsule (100 mg total) by mouth 2 (two) times daily for 5 days., Disp: 10 capsule, Rfl: 0   busPIRone (BUSPAR) 15 MG tablet, Take 15 mg by mouth in the morning and at bedtime., Disp: , Rfl:    hydrOXYzine (VISTARIL) 50 MG capsule, Take 50 mg by mouth 3 (three) times daily as needed. [employer], Disp: , Rfl:    ibuprofen (ADVIL) 600 MG tablet, Take 600 mg by mouth as needed., Disp: , Rfl:    JUNEL FE 1/20 1-20 MG-MCG tablet, TAKE 1 TABLET BY MOUTH EVERY DAY, Disp: 84 tablet, Rfl: 1   linaclotide (LINZESS) 290 MCG CAPS capsule, TAKE 1 CAPSULE BY MOUTH DAILY BEFORE BREAKFAST., Disp: 30 capsule, Rfl: 0   meclizine (ANTIVERT) 25 MG tablet, Take 1 tablet (25 mg total) by mouth 3 (three) times daily as needed for dizziness., Disp: 20 tablet, Rfl: 0   Multiple Vitamin (MULTI-VITAMIN PO), Take by mouth daily., Disp: , Rfl:    OLANZapine (ZYPREXA) 7.5 MG tablet, Take 7.5 mg by mouth at bedtime., Disp: , Rfl:    ondansetron (ZOFRAN) 4 MG tablet, Take 1 tablet (4 mg total) by mouth every 8 (eight) hours as needed for nausea or vomiting., Disp: 20 tablet, Rfl: 1   pantoprazole (PROTONIX) 40 MG tablet, TAKE 1 TABLET (40 MG TOTAL) BY MOUTH  DAILY. MUST SCHEDULE OFFICE VISIT, Disp: 30 tablet, Rfl: 0   sertraline (ZOLOFT) 100 MG tablet, Take 200 mg by mouth daily. Dr.   Ellender Hose, Disp: , Rfl:    SUBLOCADE 300 MG/1.5ML SOSY, Inject into the skin., Disp: , Rfl:    VYVANSE 60 MG capsule, Take 60 mg by mouth every morning., Disp: , Rfl:    Medications ordered in this encounter:  Meds ordered this encounter  Medications   nitrofurantoin, macrocrystal-monohydrate, (MACROBID) 100 MG capsule    Sig: Take 1 capsule (100 mg total) by mouth 2 (two) times daily for 5 days.    Dispense:  10 capsule    Refill:  0    Order Specific Question:   Supervising Provider    Answer:   Chase Picket [id number]     *If you need refills on other medications prior to your next appointment, please contact your pharmacy*  Follow-Up: Call back or seek an in-person evaluation if the symptoms worsen or if the condition fails to improve as anticipated.  [facility] [phone]  Other Instructions Take antibiotic as prescribed.  Continue to drink plenty of fluids.  Follow up for in person evaluation if no improvement.    If you have been instructed to have an in-person evaluation today at a local Urgent Care facility, please use the link below. It will take you to a list of all of our available [facility]  Urgent Cares, including address, phone number and hours of operation. Please do not delay care.  [facility]  If you or a family member do not have a primary care provider, use the link below to schedule a visit and establish care. When you choose a [facility] primary care physician or advanced practice provider, you gain a long-term partner in health. Find a Primary Care Provider  Learn more about [facility]'s in-office and virtual care options: [facility]

## 2022-09-05 DIAGNOSIS — F331 Major depressive disorder, recurrent, moderate: Secondary | ICD-10-CM | POA: Diagnosis not present

## 2022-09-07 DIAGNOSIS — F900 Attention-deficit hyperactivity disorder, predominantly inattentive type: Secondary | ICD-10-CM | POA: Diagnosis not present

## 2022-09-07 DIAGNOSIS — F331 Major depressive disorder, recurrent, moderate: Secondary | ICD-10-CM | POA: Diagnosis not present

## 2022-09-07 DIAGNOSIS — F32A Depression, unspecified: Secondary | ICD-10-CM | POA: Diagnosis not present

## 2022-09-07 DIAGNOSIS — F411 Generalized anxiety disorder: Secondary | ICD-10-CM | POA: Diagnosis not present

## 2022-09-10 DIAGNOSIS — F331 Major depressive disorder, recurrent, moderate: Secondary | ICD-10-CM | POA: Diagnosis not present

## 2022-09-12 DIAGNOSIS — F331 Major depressive disorder, recurrent, moderate: Secondary | ICD-10-CM | POA: Diagnosis not present

## 2022-09-17 DIAGNOSIS — F331 Major depressive disorder, recurrent, moderate: Secondary | ICD-10-CM | POA: Diagnosis not present

## 2022-09-18 DIAGNOSIS — Z419 Encounter for procedure for purposes other than remedying health state, unspecified: Secondary | ICD-10-CM | POA: Diagnosis not present

## 2022-09-19 DIAGNOSIS — F331 Major depressive disorder, recurrent, moderate: Secondary | ICD-10-CM | POA: Diagnosis not present

## 2022-09-20 ENCOUNTER — Other Ambulatory Visit: Payer: Self-pay | Admitting: Gastroenterology

## 2022-09-21 DIAGNOSIS — F331 Major depressive disorder, recurrent, moderate: Secondary | ICD-10-CM | POA: Diagnosis not present

## 2022-09-24 DIAGNOSIS — Z5181 Encounter for therapeutic drug level monitoring: Secondary | ICD-10-CM | POA: Diagnosis not present

## 2022-09-25 DIAGNOSIS — F331 Major depressive disorder, recurrent, moderate: Secondary | ICD-10-CM | POA: Diagnosis not present

## 2022-10-01 DIAGNOSIS — F331 Major depressive disorder, recurrent, moderate: Secondary | ICD-10-CM | POA: Diagnosis not present

## 2022-10-03 DIAGNOSIS — F331 Major depressive disorder, recurrent, moderate: Secondary | ICD-10-CM | POA: Diagnosis not present

## 2022-10-04 ENCOUNTER — Other Ambulatory Visit: Payer: Self-pay | Admitting: Gastroenterology

## 2022-10-04 ENCOUNTER — Other Ambulatory Visit: Payer: Self-pay | Admitting: Obstetrics

## 2022-10-04 DIAGNOSIS — Z30011 Encounter for initial prescription of contraceptive pills: Secondary | ICD-10-CM

## 2022-10-14 ENCOUNTER — Ambulatory Visit: Payer: Medicaid Other

## 2022-10-17 ENCOUNTER — Other Ambulatory Visit: Payer: Self-pay | Admitting: Obstetrics

## 2022-10-17 ENCOUNTER — Other Ambulatory Visit: Payer: Self-pay | Admitting: Gastroenterology

## 2022-10-17 DIAGNOSIS — Z30011 Encounter for initial prescription of contraceptive pills: Secondary | ICD-10-CM

## 2022-10-19 ENCOUNTER — Telehealth: Payer: Self-pay

## 2022-10-19 ENCOUNTER — Other Ambulatory Visit: Payer: Self-pay | Admitting: Gastroenterology

## 2022-10-19 ENCOUNTER — Other Ambulatory Visit: Payer: Self-pay

## 2022-10-19 DIAGNOSIS — Z30011 Encounter for initial prescription of contraceptive pills: Secondary | ICD-10-CM

## 2022-10-19 DIAGNOSIS — Z419 Encounter for procedure for purposes other than remedying health state, unspecified: Secondary | ICD-10-CM | POA: Diagnosis not present

## 2022-10-19 NOTE — Telephone Encounter (Signed)
Left pharm a vm.

## 2022-10-19 NOTE — Telephone Encounter (Signed)
Patient contacted office requesting for refill on Junel FE, paitents last physcial was 12/11/20, advised patient office visit would need to be scheduled prior to refill, patient verbalized understanding and was transferred to front desk to schedule. KW

## 2022-10-19 NOTE — Telephone Encounter (Signed)
The patient is calling needing refill on Birth control. We currently do not have the schedule released for May appointments with Joycelyn Schmid. Could we send in enough for a couple of months to allow the patient to get scheduled?

## 2022-10-21 ENCOUNTER — Telehealth: Payer: Self-pay

## 2022-10-21 ENCOUNTER — Other Ambulatory Visit: Payer: Self-pay | Admitting: Gastroenterology

## 2022-10-21 DIAGNOSIS — Z30011 Encounter for initial prescription of contraceptive pills: Secondary | ICD-10-CM

## 2022-10-21 MED ORDER — NORETHIN ACE-ETH ESTRAD-FE 1-20 MG-MCG PO TABS
1.0000 | ORAL_TABLET | Freq: Every day | ORAL | 0 refills | Status: DC
Start: 2022-10-21 — End: 2022-11-10

## 2022-10-21 NOTE — Telephone Encounter (Signed)
I contacted the patient via phone, she is scheduled for 11/25/22 with [facility]

## 2022-10-21 NOTE — Telephone Encounter (Signed)
Patient called she needs an appointment for to discuss birth control. She has her annual exams with her PCP she does not need an annual exam with Joyc[facility] Schmid. She only needs to come in for Medication Refill for Junel FE.

## 2022-10-22 DIAGNOSIS — Z5181 Encounter for therapeutic drug level monitoring: Secondary | ICD-10-CM | POA: Diagnosis not present

## 2022-11-02 ENCOUNTER — Telehealth: Payer: Self-pay | Admitting: Gastroenterology

## 2022-11-02 ENCOUNTER — Ambulatory Visit
Admission: RE | Admit: 2022-11-02 | Discharge: 2022-11-02 | Disposition: A | Discharge status: Home / Self Care | Payer: Medicaid Other | Source: Ambulatory Visit | Attending: Internal Medicine | Admitting: Internal Medicine

## 2022-11-02 DIAGNOSIS — Z1231 Encounter for screening mammogram for malignant neoplasm of breast: Secondary | ICD-10-CM | POA: Insufficient documentation

## 2022-11-02 MED ORDER — PANTOPRAZOLE SODIUM 40 MG PO TBEC: 40.0000 mg | DELAYED_RELEASE_TABLET | Freq: Every day | ORAL | 4 refills | Status: DC

## 2022-11-02 MED ORDER — LINACLOTIDE 290 MCG PO CAPS: ORAL_CAPSULE | ORAL | 4 refills | Status: DC

## 2022-11-02 NOTE — Telephone Encounter (Signed)
Rx sent through e-scribe  

## 2022-11-02 NOTE — Telephone Encounter (Signed)
Pt need refills on Linzess and Pantoprazole . I have made f/u appt for 03-01-2023.   [phone]  [facility] [address] [place]

## 2022-11-02 NOTE — Addendum Note (Signed)
Addended by: Roena Malady on: 11/02/2022 01:52 PM   Modules accepted: Orders

## 2022-11-04 ENCOUNTER — Ambulatory Visit (INDEPENDENT_AMBULATORY_CARE_PROVIDER_SITE_OTHER): Payer: Medicaid Other | Admitting: Internal Medicine

## 2022-11-04 VITALS — BP 122/76 | HR 78 | Ht 61.0 in | Wt 167.6 lb

## 2022-11-04 DIAGNOSIS — D485 Neoplasm of uncertain behavior of skin: Secondary | ICD-10-CM | POA: Diagnosis not present

## 2022-11-04 NOTE — Progress Notes (Signed)
Date:  11/04/2022   Name:  Lori Lawson   DOB:  July 15, 1981   MRN:  [id number]   Chief Complaint: Referral (Referral to dermatology for skin spots on face, and skin tag near buttocks. Skin spots are growing and skin tag has gotten bigger.)  Rash This is a chronic (skin lesions/moles/tags) problem. The problem is unchanged. Pertinent negatives include no fatigue or shortness of breath.  Dark spot on left cheek has been getting slowly darker for the past 2 years.  It is completely asymptomatic. She also has a pedunculated lesion on inner upper left thigh.  It is not tender and does not bleed or get caught up on clothing.   She is concerned and wants to see Dermatology.  Lab Results  Component Value Date   NA 137 07/23/2022   K 4.4 07/23/2022   CO2 24 07/23/2022   GLUCOSE 88 07/23/2022   BUN 17 07/23/2022   CREATININE 0.86 07/23/2022   CALCIUM 9.1 07/23/2022   EGFR 87 07/23/2022   GFRNONAA >60 06/17/2020   Lab Results  Component Value Date   CHOL 202 (H) 07/23/2022   HDL 45 07/23/2022   LDLCALC 112 (H) 07/23/2022   TRIG 258 (H) 07/23/2022   CHOLHDL 4.5 (H) 07/23/2022   Lab Results  Component Value Date   TSH 1.680 09/25/2020   Lab Results  Component Value Date   HGBA1C 5.4 07/23/2022   Lab Results  Component Value Date   WBC 4.8 09/25/2020   HGB 13.2 09/25/2020   HCT 40.1 09/25/2020   MCV 87 09/25/2020   PLT 355 09/25/2020   Lab Results  Component Value Date   ALT 14 07/23/2022   AST 25 07/23/2022   ALKPHOS 118 07/23/2022   BILITOT 0.2 07/23/2022   No results found for: "25OHVITD2", "25OHVITD3", "VD25OH"   Review of Systems  Constitutional:  Negative for chills and fatigue.  Respiratory:  Negative for chest tightness and shortness of breath.   Cardiovascular:  Negative for chest pain.  Skin:  Positive for rash.  Psychiatric/Behavioral:  Positive for dysphoric mood. Negative for sleep disturbance. The patient is nervous/anxious.     Patient Active  Problem List   Diagnosis Date Noted   Constipation in female 08/27/2021   Major depression single episode, in partial remission 08/27/2021   Generalized anxiety disorder 08/27/2021   Hepatitis C virus infection without hepatic coma 09/25/2020    Allergies  Allergen Reactions   Suboxone [Buprenorphine Hcl-Naloxone Hcl] Anaphylaxis and Nausea And Vomiting    Past Surgical History:  Procedure Laterality Date   CESAREAN SECTION      Social History   Tobacco Use   Smoking status: Former    Packs/day: 0    Types: Cigarettes    Quit date: 01/17/2021    Years since quitting: 1.7   Smokeless tobacco: Never  Vaping Use   Vaping Use: Every day  Substance Use Topics   Alcohol use: Yes    Comment: occ.   Drug use: Yes    Types: Marijuana     Medication list has been reviewed and updated.  Current Meds  Medication Sig   busPIRone (BUSPAR) 15 MG tablet Take 15 mg by mouth in the morning and at bedtime.   hydrOXYzine (VISTARIL) 50 MG capsule Take 50 mg by mouth 3 (three) times daily as needed. [employer]   ibuprofen (ADVIL) 600 MG tablet Take 600 mg by mouth as needed.   linaclotide (LINZESS) 290 MCG CAPS capsule TAKE  1 CAPSULE BY MOUTH DAILY BEFORE BREAKFAST.   meclizine (ANTIVERT) 25 MG tablet Take 1 tablet (25 mg total) by mouth 3 (three) times daily as needed for dizziness.   Multiple Vitamin (MULTI-VITAMIN PO) Take by mouth daily.   norethindrone-ethinyl estradiol-FE (JUNEL FE 1/20) 1-20 MG-MCG tablet Take 1 tablet by mouth daily.   OLANZapine (ZYPREXA) 7.5 MG tablet Take 7.5 mg by mouth at bedtime.   ondansetron (ZOFRAN) 4 MG tablet Take 1 tablet (4 mg total) by mouth every 8 (eight) hours as needed for nausea or vomiting.   pantoprazole (PROTONIX) 40 MG tablet Take 1 tablet (40 mg total) by mouth daily.   sertraline (ZOLOFT) 100 MG tablet Take 200 mg by mouth daily. Dr.   Evlyn Clines   SUBLOCADE 300 MG/1.5ML SOSY Inject into the skin.   VYVANSE 60 MG capsule Take  60 mg by mouth every morning.       11/04/2022    4:04 PM 07/23/2022   11:14 AM 08/27/2021    1:59 PM 09/25/2020    3:09 PM  GAD 7 : Generalized Anxiety Score  Nervous, Anxious, on Edge 3 2 1 3   Control/stop worrying 0 1 1 3   Worry too much - different things 2 1 0 3  Trouble relaxing 1 1 0 3  Restless 2 1 1 3   Easily annoyed or irritable 1 1 1 2   Afraid - awful might happen 0 0 0 1  Total GAD 7 Score 9 7 4 18   Anxiety Difficulty Somewhat difficult Somewhat difficult         11/04/2022    4:03 PM 07/23/2022   11:13 AM 08/27/2021    1:58 PM  Depression screen PHQ 2/9  Decreased Interest 1 0 1  Down, Depressed, Hopeless 0 0 0  PHQ - 2 Score 1 0 1  Altered sleeping 2 1 3   Tired, decreased energy 2 1 2   Change in appetite 0 0 0  Feeling bad or failure about yourself  0 0 0  Trouble concentrating 3 3 3   Moving slowly or fidgety/restless 2 2 0  Suicidal thoughts 0 0 0  PHQ-9 Score 10 7 9   Difficult doing work/chores Very difficult Somewhat difficult Somewhat difficult    BP Readings from Last 3 Encounters:  11/04/22 122/76  07/23/22 128/68  02/19/22 (!) 147/86    Physical Exam Vitals and nursing note reviewed.  Constitutional:      General: She is not in acute distress.    Appearance: Normal appearance. She is well-developed.  HENT:     Head: Normocephalic and atraumatic.      Comments: Irregular hyperpigmented macule  Cardiovascular:     Rate and Rhythm: Normal rate and regular rhythm.  Pulmonary:     Effort: Pulmonary effort is normal. No respiratory distress.     Breath sounds: No wheezing or rhonchi.  Skin:    General: Skin is warm and dry.     Findings: No rash.          Comments: Upper inner left thigh 0.5 cm skin tag with narrow base - non tender and non inflamed.  Neurological:     Mental Status: She is alert and oriented to person, place, and time.  Psychiatric:        Mood and Affect: Mood normal.        Behavior: Behavior normal.     Wt Readings  from Last 3 Encounters:  11/04/22 167 lb 9.6 oz (76 kg)  07/23/22 163 lb (  73.9 kg)  02/19/22 145 lb (65.8 kg)    BP 122/76   Pulse 78   Ht  (1.549 m)   Wt 167 lb 9.6 oz (76 kg)   SpO2 97%   BMI 31.67 kg/m   Assessment and Plan:  Problem List Items Addressed This Visit   None Visit Diagnoses     Neoplasm of uncertain behavior of skin    -  Primary   Lesion on face c/w keratosis  skin tag on thigh is benign recommend sun screen on face refer to dermatology - non urgent   Relevant Orders   Ambulatory referral to Dermatology       No follow-ups on file.   Partially dictated using Dragon software, any errors are not intentional.  Reubin Milan, MD [facility] [place], [place]

## 2022-11-10 ENCOUNTER — Other Ambulatory Visit: Payer: Self-pay | Admitting: Obstetrics

## 2022-11-10 DIAGNOSIS — Z30011 Encounter for initial prescription of contraceptive pills: Secondary | ICD-10-CM

## 2022-11-10 NOTE — Telephone Encounter (Signed)
I will have to reschedule the appt.  The 10:55 is not an annual spot.

## 2022-11-10 NOTE — Telephone Encounter (Signed)
I have scheduled the annual for May 20th with [facility].  Can you refill the RX?

## 2022-11-18 DIAGNOSIS — Z419 Encounter for procedure for purposes other than remedying health state, unspecified: Secondary | ICD-10-CM | POA: Diagnosis not present

## 2022-11-19 DIAGNOSIS — Z5181 Encounter for therapeutic drug level monitoring: Secondary | ICD-10-CM | POA: Diagnosis not present

## 2022-11-24 DIAGNOSIS — F411 Generalized anxiety disorder: Secondary | ICD-10-CM | POA: Diagnosis not present

## 2022-11-24 DIAGNOSIS — F32A Depression, unspecified: Secondary | ICD-10-CM | POA: Diagnosis not present

## 2022-11-25 ENCOUNTER — Ambulatory Visit: Payer: Medicaid Other | Admitting: Obstetrics

## 2022-12-07 ENCOUNTER — Encounter: Payer: Self-pay | Admitting: Obstetrics

## 2022-12-07 ENCOUNTER — Other Ambulatory Visit (HOSPITAL_COMMUNITY)
Admission: RE | Admit: 2022-12-07 | Discharge: 2022-12-07 | Disposition: A | Discharge status: Home / Self Care | Payer: Medicaid Other | Source: Ambulatory Visit | Attending: Obstetrics | Admitting: Obstetrics

## 2022-12-07 ENCOUNTER — Ambulatory Visit (INDEPENDENT_AMBULATORY_CARE_PROVIDER_SITE_OTHER): Payer: Medicaid Other | Admitting: Obstetrics

## 2022-12-07 VITALS — BP 113/76 | HR 85 | Ht 62.0 in | Wt 171.0 lb

## 2022-12-07 DIAGNOSIS — Z124 Encounter for screening for malignant neoplasm of cervix: Secondary | ICD-10-CM | POA: Diagnosis not present

## 2022-12-07 DIAGNOSIS — Z113 Encounter for screening for infections with a predominantly sexual mode of transmission: Secondary | ICD-10-CM | POA: Insufficient documentation

## 2022-12-07 DIAGNOSIS — Z01419 Encounter for gynecological examination (general) (routine) without abnormal findings: Secondary | ICD-10-CM | POA: Diagnosis not present

## 2022-12-07 DIAGNOSIS — Z30011 Encounter for initial prescription of contraceptive pills: Secondary | ICD-10-CM

## 2022-12-07 MED ORDER — NORETHIN ACE-ETH ESTRAD-FE 1-20 MG-MCG PO TABS: 1.0000 | ORAL_TABLET | Freq: Every day | ORAL | 0 refills | Status: DC

## 2022-12-07 NOTE — Progress Notes (Signed)
GYNECOLOGY ANNUAL PHYSICAL EXAM PROGRESS NOTE  Subjective:    Lori Lawson is a 42 y.o. G46P1021 female who presents for an annual exam. The patient has no complaints today. The patient is sexually active. The patient participates in regular exercise: no. Has the patient ever been transfused or tattooed?: yes. The patient reports that there is not domestic violence in her life.  She got married last year and is very happy. She  waits tables at a local eatery. She has a hx of substance abuse, and is on monthly subuclate injections that work well for her. She also taek daily medication for mood stabilization.   Menstrual History: Menarche age: 66 Patient's last menstrual period was 11/30/2022. Period Cycle (Days): 28 Period Duration (Days): 3 Period Pattern: Regular Menstrual Flow: Light Dysmenorrhea: (!) Mild Dysmenorrhea Symptoms: Cramping   Gynecologic History:  Contraception: Junel FE 1/20 History of STI's:  Last Pap: 12/11/20. Results were: normal.  Pt Denies h/o abnormal pap smears. Last mammogram: 11/02/22. Results were: normal    Upstream - 12/07/22 0924       Pregnancy Intention Screening   Does the patient want to become pregnant in the next year? No    Does the patient's partner want to become pregnant in the next year? No    Would the patient like to discuss contraceptive options today? No      Contraception Wrap Up   Current Method Oral Contraceptive    End Method Oral Contraceptive    Contraception Counseling Provided No               OB History  Gravida Para Term Preterm AB Living  3 1 1  0 2 1  SAB IAB Ectopic Multiple Live Births  2 0 0 0 0    # Outcome Date GA Lbr Len/2nd Weight Sex Delivery Anes PTL Lv  3 Term 05/10/04 [redacted]w[redacted]d   M CS-Unspec     2 SAB           1 SAB             Past Medical History:  Diagnosis Date   Anxiety    Depression    Family history of pancreatic cancer    in her mom, BRCA neg per pt report   Hepatitis C     Opioid dependence (HCC)    prescribed Suboxone by her primary doctor   PTSD (post-traumatic stress disorder)     Past Surgical History:  Procedure Laterality Date   CESAREAN SECTION      Family History  Problem Relation Age of Onset   Pancreatic cancer Mother 69       passed in 2023   Diabetes Mother    Alcohol abuse Mother    Hyperlipidemia Mother    Hypertension Mother    Tuberculosis Paternal Grandmother    Lung cancer Paternal Grandfather     Social History   Socioeconomic History   Marital status: Married    Spouse name: Not on file   Number of children: 1   Years of education: Not on file   Highest education level: 12th grade  Occupational History   Not on file  Tobacco Use   Smoking status: Former    Packs/day: 0    Types: Cigarettes    Quit date: 01/17/2021    Years since quitting: 1.8   Smokeless tobacco: Never  Vaping Use   Vaping Use: Every day  Substance and Sexual Activity   Alcohol use: Yes  Comment: occ.   Drug use: Yes    Types: Marijuana   Sexual activity: Yes    Birth control/protection: Pill  Other Topics Concern   Not on file  Social History Narrative   Not on file   Social Determinants of Health   Financial Resource Strain: Low Risk  (07/23/2022)   Overall Financial Resource Strain (CARDIA)    Difficulty of Paying Living Expenses: Not hard at all  Food Insecurity: Food Insecurity Present (11/04/2022)   Hunger Vital Sign    Worried About Running Out of Food in the Last Year: Sometimes true    Ran Out of Food in the Last Year: Never true  Transportation Needs: No Transportation Needs (11/04/2022)   PRAPARE - Administrator, Civil Service (Medical): No    Lack of Transportation (Non-Medical): No  Physical Activity: Insufficiently Active (11/04/2022)   Exercise Vital Sign    Days of Exercise per Week: 3 days    Minutes of Exercise per Session: 30 min  Stress: No Stress Concern Present (11/04/2022)   [employer] of  Occupational Health - Occupational Stress Questionnaire    Feeling of Stress : Only a little  Social Connections: Moderately Integrated (11/04/2022)   Social Connection and Isolation Panel [NHANES]    Frequency of Communication with Friends and Family: Twice a week    Frequency of Social Gatherings with Friends and Family: Once a week    Attends Religious Services: 1 to 4 times per year    Active Member of [employer] or Organizations: No    Attends Engineer, structural: Not on file    Marital Status: Married  Catering manager Violence: Not At Risk (07/23/2022)   Humiliation, Afraid, Rape, and Kick questionnaire    Fear of Current or Ex-Partner: No    Emotionally Abused: No    Physically Abused: No    Sexually Abused: No    Current Outpatient Medications on File Prior to Visit  Medication Sig Dispense Refill   busPIRone (BUSPAR) 15 MG tablet Take 15 mg by mouth in the morning and at bedtime.     hydrOXYzine (VISTARIL) 50 MG capsule Take 50 mg by mouth 3 (three) times daily as needed. [facility]     ibuprofen (ADVIL) 600 MG tablet Take 600 mg by mouth as needed.     linaclotide (LINZESS) 290 MCG CAPS capsule TAKE 1 CAPSULE BY MOUTH DAILY BEFORE BREAKFAST. 30 capsule 4   meclizine (ANTIVERT) 25 MG tablet Take 1 tablet (25 mg total) by mouth 3 (three) times daily as needed for dizziness. 20 tablet 0   Multiple Vitamin (MULTI-VITAMIN PO) Take by mouth daily.     norethindrone-ethinyl estradiol-FE (JUNEL FE 1/20) 1-20 MG-MCG tablet TAKE 1 TABLET BY MOUTH EVERY DAY 28 tablet 0   OLANZapine (ZYPREXA) 7.5 MG tablet Take 7.5 mg by mouth at bedtime.     ondansetron (ZOFRAN) 4 MG tablet Take 1 tablet (4 mg total) by mouth every 8 (eight) hours as needed for nausea or vomiting. 20 tablet 1   pantoprazole (PROTONIX) 40 MG tablet Take 1 tablet (40 mg total) by mouth daily. 30 tablet 4   sertraline (ZOLOFT) 100 MG tablet Take 200 mg by mouth daily. Dr.   Evlyn Clines     SUBLOCADE 300  MG/1.5ML SOSY Inject into the skin.     VYVANSE 60 MG capsule Take 60 mg by mouth every morning.     No current facility-administered medications on file prior to visit.  Allergies  Allergen Reactions   Suboxone [Buprenorphine Hcl-Naloxone Hcl] Anaphylaxis and Nausea And Vomiting     Review of Systems Constitutional: negative for chills, fatigue, fevers and sweats Eyes: negative for irritation, redness and visual disturbance Ears, nose, mouth, throat, and face: negative for hearing loss, nasal congestion, snoring and tinnitus Respiratory: negative for asthma, cough, sputum Cardiovascular: negative for chest pain, dyspnea, exertional chest pressure/discomfort, irregular heart beat, palpitations and syncope Gastrointestinal: negative for abdominal pain, change in bowel habits, nausea and vomiting Genitourinary: negative for abnormal menstrual periods, genital lesions, sexual problems and vaginal discharge, dysuria and urinary incontinence Integument/breast: negative for breast lump, breast tenderness and nipple discharge Hematologic/lymphatic: negative for bleeding and easy bruising Musculoskeletal:negative for back pain and muscle weakness Neurological: negative for dizziness, headaches, vertigo and weakness Endocrine: negative for diabetic symptoms including polydipsia, polyuria and skin dryness Allergic/Immunologic: negative for hay fever and urticaria      Objective:  Blood pressure (!) 131/100, pulse 85, height 5\' 2"  (1.575 m), weight 171 lb (77.6 kg), last menstrual period 11/30/2022. Body mass index is 31.28 kg/m.    General Appearance:    Alert, cooperative, no distress, appears stated age  Head:    Normocephalic, without obvious abnormality, atraumatic  Eyes:    PERRL, conjunctiva/corneas clear, EOM's intact, both eyes  Ears:    Normal external ear canals, both ears  Nose:   Nares normal, septum midline, mucosa normal, no drainage or sinus tenderness  Throat:   Lips,  mucosa, and tongue normal; teeth and gums normal  Neck:   Supple, symmetrical, trachea midline, no adenopathy; thyroid: no enlargement/tenderness/nodules; no carotid bruit or JVD  Back:     Symmetric, no curvature, ROM normal, no CVA tenderness  Lungs:     Clear to auscultation bilaterally, respirations unlabored  Chest Wall:    No tenderness or deformity   Heart:    Regular rate and rhythm, S1 and S2 normal, no murmur, rub or gallop  Breast Exam:    No tenderness, masses, or nipple abnormality  Abdomen:     Soft, non-tender, bowel sounds active all four quadrants, no masses, no organomegaly.    Genitalia:    Pelvic:external genitalia normal, vagina without lesions, discharge, or tenderness, rectovaginal septum  normal. Cervix normal in appearance, no cervical motion tenderness, no adnexal masses or tenderness.  Uterus normal size, shape, mobile, regular contours, nontender.  Rectal:    Normal external sphincter.  No hemorrhoids appreciated. Internal exam not done.   Extremities:   Extremities normal, atraumatic, no cyanosis or edema  Pulses:   2+ and symmetric all extremities  Skin:   Skin color, texture, turgor normal, no rashes or lesions  Lymph nodes:   Cervical, supraclavicular, and axillary nodes normal  Neurologic:   CNII-XII intact, normal strength, sensation and reflexes throughout   .  Labs:  Lab Results  Component Value Date   WBC 4.8 09/25/2020   HGB 13.2 09/25/2020   HCT 40.1 09/25/2020   MCV 87 09/25/2020   PLT 355 09/25/2020    Lab Results  Component Value Date   CREATININE 0.86 07/23/2022   BUN 17 07/23/2022   NA 137 07/23/2022   K 4.4 07/23/2022   CL 100 07/23/2022   CO2 24 07/23/2022    Lab Results  Component Value Date   ALT 14 07/23/2022   AST 25 07/23/2022   ALKPHOS 118 07/23/2022   BILITOT 0.2 07/23/2022    Lab Results  Component Value Date   TSH 1.680 09/25/2020  Assessment:   1. Women's annual routine gynecological examination       Plan:  Blood tests: none today. Done by her PCP. Breast self exam technique reviewed and patient encouraged to perform self-exam monthly. Contraception: OCP (estrogen/progesterone). Discussed healthy lifestyle modifications. Mammogram  recently done in April- Rad 1 Pap smear ordered. I have renewed her Junel for the year. Follow up in 1 year for annual exam   Paula Compton CNM [facility]

## 2022-12-08 LAB — CERVICOVAGINAL ANCILLARY ONLY
Bacterial Vaginitis (gardnerella): NEGATIVE
Candida Glabrata: POSITIVE — AB
Candida Vaginitis: NEGATIVE
Chlamydia: NEGATIVE
Comment: NEGATIVE
Comment: NEGATIVE
Comment: NEGATIVE
Comment: NEGATIVE
Comment: NEGATIVE
Comment: NORMAL
Neisseria Gonorrhea: NEGATIVE
Trichomonas: NEGATIVE

## 2022-12-09 ENCOUNTER — Encounter: Payer: Self-pay | Admitting: Obstetrics

## 2022-12-09 LAB — CYTOLOGY - PAP
Comment: NEGATIVE
Diagnosis: NEGATIVE
High risk HPV: NEGATIVE

## 2022-12-10 ENCOUNTER — Ambulatory Visit
Admission: RE | Admit: 2022-12-10 | Discharge: 2022-12-10 | Disposition: A | Discharge status: Home / Self Care | Payer: Medicaid Other | Source: Ambulatory Visit | Attending: Family Medicine | Admitting: Family Medicine

## 2022-12-10 ENCOUNTER — Ambulatory Visit (INDEPENDENT_AMBULATORY_CARE_PROVIDER_SITE_OTHER): Payer: Medicaid Other

## 2022-12-10 VITALS — BP 129/93 | HR 93 | Temp 99.0°F | Resp 16 | Ht 62.0 in | Wt 170.0 lb

## 2022-12-10 DIAGNOSIS — M79671 Pain in right foot: Secondary | ICD-10-CM

## 2022-12-10 DIAGNOSIS — M7989 Other specified soft tissue disorders: Secondary | ICD-10-CM | POA: Diagnosis not present

## 2022-12-10 DIAGNOSIS — R609 Edema, unspecified: Secondary | ICD-10-CM | POA: Diagnosis not present

## 2022-12-10 MED ORDER — NAPROXEN 500 MG PO TABS: 500.0000 mg | ORAL_TABLET | Freq: Two times a day (BID) | ORAL | 0 refills | Status: AC

## 2022-12-10 NOTE — ED Triage Notes (Signed)
Pt c/o R foot pain x1 wk. States top of foot swollen and hurts to put pressure on it. Denies any falls or injuries.

## 2022-12-10 NOTE — ED Provider Notes (Signed)
[facility]    CSN: [id number] Arrival date & time: 12/10/22  1103      History   Chief Complaint Chief Complaint  Patient presents with   Foot Pain    Appt    HPI  HPI Natalyia Dreisbach is a 42 y.o. female.   Keoshia presents for right foot pain for the past week. She is unsure if she injured her foot or what exactly happened. States "I'm very accident prone."  Has never injuried this foot before. Does not hear any pop or abnormal sounds.  Continues to have pain when moving her foot. Denies redness. Has swelling on the top of her foot. Does not feel like her leg is weak.  Has no other concerns.   Past Medical History:  Diagnosis Date   Anxiety    Depression    Family history of pancreatic cancer    in her mom, BRCA neg per pt report   Hepatitis C    Opioid dependence (HCC)    prescribed Suboxone by her primary doctor   PTSD (post-traumatic stress disorder)     Patient Active Problem List   Diagnosis Date Noted   Constipation in female 08/27/2021   Major depression single episode, in partial remission (HCC) 08/27/2021   Generalized anxiety disorder 08/27/2021   Hepatitis C virus infection without hepatic coma 09/25/2020    Past Surgical History:  Procedure Laterality Date   CESAREAN SECTION      OB History     Gravida  3   Para  1   Term  1   Preterm      AB  2   Living  1      SAB  2   IAB      Ectopic      Multiple      Live Births               Home Medications    Prior to Admission medications   Medication Sig Start Date End Date Taking? Authorizing Provider  busPIRone (BUSPAR) 15 MG tablet Take 15 mg by mouth in the morning and at bedtime.   Yes [provider]  hydrOXYzine (VISTARIL) 50 MG capsule Take 50 mg by mouth 3 (three) times daily as needed. [facility]   Yes [provider]  ibuprofen (ADVIL) 600 MG tablet Take 600 mg by mouth as needed.   Yes [provider]   linaclotide (LINZESS) 290 MCG CAPS capsule TAKE 1 CAPSULE BY MOUTH DAILY BEFORE BREAKFAST. 11/02/22  Yes Midge Minium, MD  meclizine (ANTIVERT) 25 MG tablet Take 1 tablet (25 mg total) by mouth 3 (three) times daily as needed for dizziness. 08/27/21  Yes Reubin Milan, MD  Multiple Vitamin (MULTI-VITAMIN PO) Take by mouth daily.   Yes [provider]  naproxen (NAPROSYN) 500 MG tablet Take 1 tablet (500 mg total) by mouth 2 (two) times daily with a meal. 12/10/22  Yes Baelyn Doring, DO  norethindrone-ethinyl estradiol-FE (JUNEL FE 1/20) 1-20 MG-MCG tablet Take 1 tablet by mouth daily. 12/07/22  Yes Mirna Mires, CNM  OLANZapine (ZYPREXA) 7.5 MG tablet Take 7.5 mg by mouth at bedtime. 07/06/22  Yes [provider]  ondansetron (ZOFRAN) 4 MG tablet Take 1 tablet (4 mg total) by mouth every 8 (eight) hours as needed for nausea or vomiting. 12/23/20  Yes Midge Minium, MD  pantoprazole (PROTONIX) 40 MG tablet Take 1 tablet (40 mg total) by mouth daily. 11/02/22  Yes Midge Minium, MD  sertraline (ZOLOFT) 100 MG tablet Take 200 mg by mouth daily. Dr.   Evlyn Clines   Yes [provider]  SUBLOCADE 300 MG/1.5ML SOSY Inject into the skin. 06/29/22  Yes [provider]  VYVANSE 60 MG capsule Take 60 mg by mouth every morning. 07/03/22  Yes [provider]    Family History Family History  Problem Relation Age of Onset   Pancreatic cancer Mother 34       passed in 2023   Diabetes Mother    Alcohol abuse Mother    Hyperlipidemia Mother    Hypertension Mother    Tuberculosis Paternal Grandmother    Lung cancer Paternal Grandfather     Social History Social History   Tobacco Use   Smoking status: Former    Packs/day: 0    Types: Cigarettes    Quit date: 01/17/2021    Years since quitting: 1.8   Smokeless tobacco: Never  Vaping Use   Vaping Use: Every day  Substance Use Topics   Alcohol use: Yes    Comment: occ.   Drug use: Yes    Types:  Marijuana     Allergies   Suboxone [buprenorphine hcl-naloxone hcl]   Review of Systems Review of Systems: :negative unless otherwise stated in HPI.      Physical Exam Triage Vital Signs ED Triage Vitals [12/10/22 1113]  Enc Vitals Group     BP      Pulse      Resp 16     Temp      Temp Source Oral     SpO2      Weight      Height      Head Circumference      Peak Flow      Pain Score      Pain Loc      Pain Edu?      Excl. in GC?    No data found.  Updated Vital Signs BP (!) 129/93 (BP Location: Right Arm)   Pulse 93   Temp 99 F (37.2 C) (Oral)   Resp 16   Ht 5\' 2"  (1.575 m)   Wt 77.1 kg   LMP 11/30/2022   SpO2 97%   BMI 31.09 kg/m   Visual Acuity Right Eye Distance:   Left Eye Distance:   Bilateral Distance:    Right Eye Near:   Left Eye Near:    Bilateral Near:     Physical Exam GEN: well appearing female in no acute distress  CVS: well perfused  RESP: speaking in full sentences without pause, no respiratory distress  MSK:   Ankle/Foot, right: TTP noted at the dorsal midfoot. No visible erythema, ecchymosis, or bony deformity. + Mild edema. No notable pes planus/cavus deformity. Transverse arch grossly intact; No evidence of tibiotalar deviation; Range of motion is full in all directions. Strength is 5/5 in all directions. No tenderness at the insertion/body/myotendinous junction of the Achilles tendon;  No tenderness on posterior aspects of lateral and medial malleolus;  Talar dome non-tender; Unremarkable calcaneal squeeze; No plantar calcaneal tenderness; No tenderness over the navicular prominence; No tenderness over cuboid; No pain at base of 5th MT; Tenderness at the distal 2-4th metatarsals;  Able to walk 4 steps but with pain.     UC Treatments / Results  Labs (all labs ordered are listed, but only abnormal results are displayed) Labs Reviewed - No data to display  EKG   Radiology  DG Foot Complete Right  Result Date:  12/10/2022 CLINICAL DATA:  Pain and edema for 1 week. EXAM: RIGHT FOOT COMPLETE - 3+ VIEW COMPARISON:  None Available. FINDINGS: There is no evidence of fracture or dislocation. There is no evidence of arthropathy or other focal bone abnormality. Soft tissues swelling about the dorsum of the foot. IMPRESSION: Soft tissue swelling about the dorsum of the foot. No acute fracture or dislocation. Electronically Signed   By: Larose Hires D.O.   On: 12/10/2022 11:45     Procedures Procedures (including critical care time)  Medications Ordered in UC Medications - No data to display  Initial Impression / Assessment and Plan / UC Course  I have reviewed the triage vital signs and the nursing notes.  Pertinent labs & imaging results that were available during my care of the patient were reviewed by me and considered in my medical decision making (see chart for details).      Pt is a 42 y.o.  female with right foot pain for the past week. On exam, pt has tenderness and edema of dorsal midfoot. Obtained right foot plain films.  Personally interpreted by me were unremarkable for fracture or dislocation. Radiologist report reviewed and additionally notes soft tissue swelling at the dorsum of foot.  Ace wrap applied prior to discharge.  Patient to gradually return to normal activities, as tolerated and continue ordinary activities within the limits permitted by pain. Prescribed Naproxen sodium for pain relief.  Tylenol PRN. Advised patient to avoid OTC NSAIDs while taking prescription NSAID. Counseled patient on red flag symptoms and when to seek immediate care.  Patient to follow up with orthopedic provider, if symptoms do not improve with conservative treatment.  Return and ED precautions given. Understanding voiced. Discussed MDM, treatment plan and plan for follow-up with patient who agrees with plan.   Final Clinical Impressions(s) / UC Diagnoses   Final diagnoses:  Foot pain, right     Discharge  Instructions      Your xray did not show any broken or dislocated bones. Take Naprosyn for pain .      ED Prescriptions     Medication Sig Dispense Auth. Provider   naproxen (NAPROSYN) 500 MG tablet Take 1 tablet (500 mg total) by mouth 2 (two) times daily with a meal. 30 tablet Emory Gallentine, DO      PDMP not reviewed this encounter.   Katha Cabal, DO 12/10/22 1155

## 2022-12-10 NOTE — Discharge Instructions (Signed)
Your xray did not show any broken or dislocated bones. Take Naprosyn for pain .

## 2022-12-15 ENCOUNTER — Encounter: Payer: Self-pay | Admitting: Internal Medicine

## 2022-12-17 ENCOUNTER — Encounter: Payer: Self-pay | Admitting: Obstetrics

## 2022-12-17 ENCOUNTER — Other Ambulatory Visit: Payer: Self-pay | Admitting: Obstetrics

## 2022-12-17 DIAGNOSIS — B3731 Acute candidiasis of vulva and vagina: Secondary | ICD-10-CM

## 2022-12-17 MED ORDER — FLUCONAZOLE 150 MG PO TABS
150.0000 mg | ORAL_TABLET | Freq: Once | ORAL | 0 refills | Status: AC
Start: 2022-12-17 — End: 2022-12-17

## 2022-12-18 DIAGNOSIS — Z5181 Encounter for therapeutic drug level monitoring: Secondary | ICD-10-CM | POA: Diagnosis not present

## 2022-12-19 DIAGNOSIS — Z419 Encounter for procedure for purposes other than remedying health state, unspecified: Secondary | ICD-10-CM | POA: Diagnosis not present

## 2022-12-23 DIAGNOSIS — F32A Depression, unspecified: Secondary | ICD-10-CM | POA: Diagnosis not present

## 2022-12-30 ENCOUNTER — Other Ambulatory Visit: Payer: Self-pay | Admitting: Obstetrics

## 2022-12-30 DIAGNOSIS — Z30011 Encounter for initial prescription of contraceptive pills: Secondary | ICD-10-CM

## 2023-01-15 DIAGNOSIS — Z5181 Encounter for therapeutic drug level monitoring: Secondary | ICD-10-CM | POA: Diagnosis not present

## 2023-01-18 DIAGNOSIS — Z419 Encounter for procedure for purposes other than remedying health state, unspecified: Secondary | ICD-10-CM | POA: Diagnosis not present

## 2023-01-20 DIAGNOSIS — F32A Depression, unspecified: Secondary | ICD-10-CM | POA: Diagnosis not present

## 2023-01-20 DIAGNOSIS — F900 Attention-deficit hyperactivity disorder, predominantly inattentive type: Secondary | ICD-10-CM | POA: Diagnosis not present

## 2023-01-26 NOTE — Telephone Encounter (Signed)
This pt usually sees Dr.Cherry.  She does not have any available appts as of right now.  Her August schedule is not fully open though.

## 2023-01-27 NOTE — Telephone Encounter (Signed)
What kind of appt does she need?

## 2023-01-28 ENCOUNTER — Other Ambulatory Visit: Payer: Self-pay | Admitting: Gastroenterology

## 2023-02-03 ENCOUNTER — Encounter: Payer: Self-pay | Admitting: Internal Medicine

## 2023-02-03 ENCOUNTER — Ambulatory Visit (INDEPENDENT_AMBULATORY_CARE_PROVIDER_SITE_OTHER): Payer: Medicaid Other | Admitting: Internal Medicine

## 2023-02-03 VITALS — BP 128/74 | HR 106 | Ht 62.0 in | Wt 177.0 lb

## 2023-02-03 DIAGNOSIS — R233 Spontaneous ecchymoses: Secondary | ICD-10-CM

## 2023-02-03 NOTE — Patient Instructions (Signed)
Wear knee high compression stockings when able.  Drink plenty of fluids, limit sodium intake.

## 2023-02-03 NOTE — Progress Notes (Signed)
Date:  02/03/2023   Name:  Lori Lawson   DOB:  March 25, 1981   MRN:  [id number]   Chief Complaint: skin changes (X 2 months. Burning and tingling feelings on both ankles. )  Rash This is a new problem. The current episode started more than 1 month ago. The problem is unchanged. The affected locations include the left lower leg and right lower leg. Rash characteristics: tiny punctate redish lesions. She was exposed to nothing. Pertinent negatives include no congestion, cough, fever or shortness of breath. Past treatments include nothing.    Lab Results  Component Value Date   NA 137 07/23/2022   K 4.4 07/23/2022   CO2 24 07/23/2022   GLUCOSE 88 07/23/2022   BUN 17 07/23/2022   CREATININE 0.86 07/23/2022   CALCIUM 9.1 07/23/2022   EGFR 87 07/23/2022   GFRNONAA >60 06/17/2020   Lab Results  Component Value Date   CHOL 202 (H) 07/23/2022   HDL 45 07/23/2022   LDLCALC 112 (H) 07/23/2022   TRIG 258 (H) 07/23/2022   CHOLHDL 4.5 (H) 07/23/2022   Lab Results  Component Value Date   TSH 1.680 09/25/2020   Lab Results  Component Value Date   HGBA1C 5.4 07/23/2022   Lab Results  Component Value Date   WBC 4.8 09/25/2020   HGB 13.2 09/25/2020   HCT 40.1 09/25/2020   MCV 87 09/25/2020   PLT 355 09/25/2020   Lab Results  Component Value Date   ALT 14 07/23/2022   AST 25 07/23/2022   ALKPHOS 118 07/23/2022   BILITOT 0.2 07/23/2022   No results found for: "25OHVITD2", "25OHVITD3", "VD25OH"   Review of Systems  Constitutional:  Negative for fever.  HENT:  Negative for congestion.   Respiratory:  Negative for cough and shortness of breath.   Cardiovascular:  Positive for leg swelling (some swelling at the end of the day). Negative for chest pain.  Skin:  Positive for rash.    Patient Active Problem List   Diagnosis Date Noted   Constipation in female 08/27/2021   Major depression single episode, in partial remission (HCC) 08/27/2021   Generalized anxiety disorder  08/27/2021   Hepatitis C virus infection without hepatic coma 09/25/2020    Allergies  Allergen Reactions   Suboxone [Buprenorphine Hcl-Naloxone Hcl] Anaphylaxis and Nausea And Vomiting    Past Surgical History:  Procedure Laterality Date   CESAREAN SECTION      Social History   Tobacco Use   Smoking status: Former    Current packs/day: 0.00    Types: Cigarettes    Quit date: 01/17/2021    Years since quitting: 2.0   Smokeless tobacco: Never  Vaping Use   Vaping status: Every Day  Substance Use Topics   Alcohol use: Yes    Comment: occ.   Drug use: Yes    Types: Marijuana     Medication list has been reviewed and updated.  Current Meds  Medication Sig   busPIRone (BUSPAR) 15 MG tablet Take 15 mg by mouth in the morning and at bedtime.   hydrOXYzine (VISTARIL) 50 MG capsule Take 50 mg by mouth 3 (three) times daily as needed. [facility]   ibuprofen (ADVIL) 600 MG tablet Take 600 mg by mouth as needed.   linaclotide (LINZESS) 290 MCG CAPS capsule TAKE 1 CAPSULE BY MOUTH DAILY BEFORE BREAKFAST.   meclizine (ANTIVERT) 25 MG tablet Take 1 tablet (25 mg total) by mouth 3 (three) times daily as needed  for dizziness.   Multiple Vitamin (MULTI-VITAMIN PO) Take by mouth daily.   naproxen (NAPROSYN) 500 MG tablet Take 1 tablet (500 mg total) by mouth 2 (two) times daily with a meal.   norethindrone-ethinyl estradiol-FE (JUNEL FE 1/20) 1-20 MG-MCG tablet TAKE 1 TABLET BY MOUTH EVERY DAY   OLANZapine (ZYPREXA) 7.5 MG tablet Take 7.5 mg by mouth at bedtime.   ondansetron (ZOFRAN) 4 MG tablet Take 1 tablet (4 mg total) by mouth every 8 (eight) hours as needed for nausea or vomiting.   pantoprazole (PROTONIX) 40 MG tablet TAKE 1 TABLET BY MOUTH EVERY DAY   sertraline (ZOLOFT) 100 MG tablet Take 200 mg by mouth daily. Dr.   Evlyn Clines   SUBLOCADE 300 MG/1.5ML SOSY Inject into the skin.   VYVANSE 60 MG capsule Take 60 mg by mouth every morning.       02/03/2023    3:18  PM 11/04/2022    4:04 PM 07/23/2022   11:14 AM 08/27/2021    1:59 PM  GAD 7 : Generalized Anxiety Score  Nervous, Anxious, on Edge 3 3 2 1   Control/stop worrying 1 0 1 1  Worry too much - different things 1 2 1  0  Trouble relaxing 1 1 1  0  Restless 3 2 1 1   Easily annoyed or irritable 1 1 1 1   Afraid - awful might happen 0 0 0 0  Total GAD 7 Score 10 9 7 4   Anxiety Difficulty Very difficult Somewhat difficult Somewhat difficult        02/03/2023    3:18 PM 11/04/2022    4:03 PM 07/23/2022   11:13 AM  Depression screen PHQ 2/9  Decreased Interest 1 1 0  Down, Depressed, Hopeless 0 0 0  PHQ - 2 Score 1 1 0  Altered sleeping 1 2 1   Tired, decreased energy 2 2 1   Change in appetite 1 0 0  Feeling bad or failure about yourself  0 0 0  Trouble concentrating 3 3 3   Moving slowly or fidgety/restless 3 2 2   Suicidal thoughts 0 0 0  PHQ-9 Score 11 10 7   Difficult doing work/chores Very difficult Very difficult Somewhat difficult    BP Readings from Last 3 Encounters:  02/03/23 128/74  12/10/22 (!) 129/93  12/07/22 113/76    Physical Exam Vitals and nursing note reviewed.  Constitutional:      General: She is not in acute distress.    Appearance: Normal appearance. She is well-developed.  HENT:     Head: Normocephalic and atraumatic.  Cardiovascular:     Rate and Rhythm: Normal rate and regular rhythm.     Heart sounds: No murmur heard. Pulmonary:     Effort: Pulmonary effort is normal. No respiratory distress.     Breath sounds: No wheezing or rhonchi.  Musculoskeletal:        General: Swelling (trace ankle edema) present.     Cervical back: Normal range of motion.     Right lower leg: No edema.     Left lower leg: No edema.  Lymphadenopathy:     Cervical: No cervical adenopathy.  Skin:    General: Skin is warm and dry.     Findings: Petechiae present. No rash.     Comments: Non blanching pin point lesions over both lower shins, L>R  Neurological:     Mental Status:  She is alert and oriented to person, place, and time.  Psychiatric:        Mood  and Affect: Mood normal.        Behavior: Behavior normal.     Wt Readings from Last 3 Encounters:  02/03/23 177 lb (80.3 kg)  12/10/22 170 lb (77.1 kg)  12/07/22 171 lb (77.6 kg)    BP 128/74 (BP Location: Right Arm, Cuff Size: Large)   Pulse (!) 106   Ht 5\' 2"  (1.575 m)   Wt 177 lb (80.3 kg)   LMP 01/13/2023 (Exact Date)   SpO2 97%   BMI 32.37 kg/m   Assessment and Plan:  Problem List Items Addressed This Visit   None Visit Diagnoses     Petechial rash    -  Primary   Recommend support stockings and elevation decrease sodium intake and increase fluids   Relevant Orders   CBC with Differential/Platelet   Comprehensive metabolic panel   TSH       No follow-ups on file.    Reubin Milan, MD [facility] [place]

## 2023-02-04 LAB — COMPREHENSIVE METABOLIC PANEL
ALT: 10 IU/L (ref 0–32)
AST: 22 IU/L (ref 0–40)
Albumin: 4.4 g/dL (ref 3.9–4.9)
Alkaline Phosphatase: 107 IU/L (ref 44–121)
BUN/Creatinine Ratio: 16 (ref 9–23)
BUN: 13 mg/dL (ref 6–24)
Bilirubin Total: 0.3 mg/dL (ref 0.0–1.2)
CO2: 22 mmol/L (ref 20–29)
Calcium: 9.4 mg/dL (ref 8.7–10.2)
Chloride: 100 mmol/L (ref 96–106)
Creatinine, Ser: 0.79 mg/dL (ref 0.57–1.00)
Globulin, Total: 2.6 g/dL (ref 1.5–4.5)
Glucose: 98 mg/dL (ref 70–99)
Potassium: 4.3 mmol/L (ref 3.5–5.2)
Sodium: 136 mmol/L (ref 134–144)
Total Protein: 7 g/dL (ref 6.0–8.5)
eGFR: 96 mL/min/{1.73_m2} (ref >59)

## 2023-02-04 LAB — CBC WITH DIFFERENTIAL/PLATELET
Basophils Absolute: 0 10*3/uL (ref 0.0–0.2)
Basos: 0 %
EOS (ABSOLUTE): 0.1 10*3/uL (ref 0.0–0.4)
Eos: 2 %
Hematocrit: 35.6 % (ref 34.0–46.6)
Hemoglobin: 12 g/dL (ref 11.1–15.9)
Immature Grans (Abs): 0 10*3/uL (ref 0.0–0.1)
Immature Granulocytes: 0 %
Lymphocytes Absolute: 1.5 10*3/uL (ref 0.7–3.1)
Lymphs: 32 %
MCH: 29.6 pg (ref 26.6–33.0)
MCHC: 33.7 g/dL (ref 31.5–35.7)
MCV: 88 fL (ref 79–97)
Monocytes Absolute: 0.4 10*3/uL (ref 0.1–0.9)
Monocytes: 8 %
Neutrophils Absolute: 2.7 10*3/uL (ref 1.4–7.0)
Neutrophils: 58 %
Platelets: 301 10*3/uL (ref 150–450)
RBC: 4.06 x10E6/uL (ref 3.77–5.28)
RDW: 12.5 % (ref 11.7–15.4)
WBC: 4.6 10*3/uL (ref 3.4–10.8)

## 2023-02-04 LAB — TSH: TSH: 1.4 u[IU]/mL (ref 0.450–4.500)

## 2023-02-10 ENCOUNTER — Ambulatory Visit: Payer: Medicaid Other | Admitting: Internal Medicine

## 2023-02-11 DIAGNOSIS — Z5181 Encounter for therapeutic drug level monitoring: Secondary | ICD-10-CM | POA: Diagnosis not present

## 2023-02-18 DIAGNOSIS — Z419 Encounter for procedure for purposes other than remedying health state, unspecified: Secondary | ICD-10-CM | POA: Diagnosis not present

## 2023-03-01 ENCOUNTER — Ambulatory Visit (INDEPENDENT_AMBULATORY_CARE_PROVIDER_SITE_OTHER): Payer: Medicaid Other | Admitting: Gastroenterology

## 2023-03-01 ENCOUNTER — Encounter: Payer: Self-pay | Admitting: Gastroenterology

## 2023-03-01 VITALS — BP 139/97 | HR 108 | Temp 98.4°F | Ht 62.0 in | Wt 178.0 lb

## 2023-03-01 DIAGNOSIS — K5900 Constipation, unspecified: Secondary | ICD-10-CM

## 2023-03-01 DIAGNOSIS — R140 Abdominal distension (gaseous): Secondary | ICD-10-CM | POA: Diagnosis not present

## 2023-03-01 DIAGNOSIS — K5904 Chronic idiopathic constipation: Secondary | ICD-10-CM | POA: Diagnosis not present

## 2023-03-01 NOTE — Progress Notes (Signed)
Primary Care Physician: Reubin Milan, MD  Primary Gastroenterologist:  Dr. Midge Minium  Chief Complaint  Patient presents with   Constipation    HPI: Lori Lawson is a 42 y.o. female here after seeing me in the past for hepatitis C and receiving treatment.  The patient was also being treated for constipation.  The patient was started on Linzess 145 mcg a day.  When I had last seen her back in 2022 she had reported that the constipation was a change in bowel habits for her and she was recommended to undergo a colonoscopy.  It does not appear that she did have a colonoscopy done. It appears that the patient after starting the 145 mcg a day had been switched to 290 mcg.  The patient only has a few left and has been given samples of the 290 mcg and has been told to add fiber to her diet.  The patient has been taking a stool softener before she goes to sleep to help her move her bowels.  Past Medical History:  Diagnosis Date   Anxiety    Depression    Family history of pancreatic cancer    in her mom, BRCA neg per pt report   Hepatitis C    Opioid dependence (HCC)    prescribed Suboxone by her primary doctor   PTSD (post-traumatic stress disorder)     Current Outpatient Medications  Medication Sig Dispense Refill   busPIRone (BUSPAR) 15 MG tablet Take 15 mg by mouth in the morning and at bedtime.     hydrOXYzine (VISTARIL) 50 MG capsule Take 50 mg by mouth 3 (three) times daily as needed. [facility]     ibuprofen (ADVIL) 600 MG tablet Take 600 mg by mouth as needed.     linaclotide (LINZESS) 290 MCG CAPS capsule TAKE 1 CAPSULE BY MOUTH DAILY BEFORE BREAKFAST. 30 capsule 4   meclizine (ANTIVERT) 25 MG tablet Take 1 tablet (25 mg total) by mouth 3 (three) times daily as needed for dizziness. 20 tablet 0   Multiple Vitamin (MULTI-VITAMIN PO) Take by mouth daily.     naproxen (NAPROSYN) 500 MG tablet Take 1 tablet (500 mg total) by mouth 2 (two) times daily with a  meal. 30 tablet 0   norethindrone-ethinyl estradiol-FE (JUNEL FE 1/20) 1-20 MG-MCG tablet TAKE 1 TABLET BY MOUTH EVERY DAY 84 tablet 3   OLANZapine (ZYPREXA) 7.5 MG tablet Take 7.5 mg by mouth at bedtime.     ondansetron (ZOFRAN) 4 MG tablet Take 1 tablet (4 mg total) by mouth every 8 (eight) hours as needed for nausea or vomiting. 20 tablet 1   pantoprazole (PROTONIX) 40 MG tablet TAKE 1 TABLET BY MOUTH EVERY DAY 90 tablet 0   sertraline (ZOLOFT) 100 MG tablet Take 200 mg by mouth daily. Dr.   Evlyn Clines     SUBLOCADE 300 MG/1.5ML SOSY Inject into the skin.     VYVANSE 60 MG capsule Take 60 mg by mouth every morning.     No current facility-administered medications for this visit.    Allergies as of 03/01/2023 - Review Complete 03/01/2023  Allergen Reaction Noted   Suboxone [buprenorphine hcl-naloxone hcl] Anaphylaxis and Nausea And Vomiting 02/19/2022    ROS:  General: Negative for anorexia, weight loss, fever, chills, fatigue, weakness. ENT: Negative for hoarseness, difficulty swallowing , nasal congestion. CV: Negative for chest pain, angina, palpitations, dyspnea on exertion, peripheral edema.  Respiratory: Negative for dyspnea at rest, dyspnea on exertion,  cough, sputum, wheezing.  GI: See history of present illness. GU:  Negative for dysuria, hematuria, urinary incontinence, urinary frequency, nocturnal urination.  Endo: Negative for unusual weight change.    Physical Examination:   BP (!) 139/97 (BP Location: Left Arm, Patient Position: Sitting, Cuff Size: Normal)   Pulse (!) 108   Temp 98.4 F (36.9 C) (Oral)   Ht 5\' 2"  (1.575 m)   Wt 178 lb (80.7 kg)   LMP 01/13/2023 (Exact Date)   BMI 32.56 kg/m   General: Well-nourished, well-developed in no acute distress.  Eyes: No icterus. Conjunctivae pink. Neuro: Alert and oriented x 3.  Grossly intact. Skin: Warm and dry, no jaundice.   Psych: Alert and cooperative, normal mood and affect.  Labs:    Imaging Studies: No  results found.  Assessment and Plan:   Lori Lawson is a 42 y.o. y/o female who comes in with chronic idiopathic constipation and bloating.  The patient has been given some samples of the 290 mcg of Linzess to hold her over since she has only a few left and she will at fiber to her daily regiment.  The patient has been told that if this works for her she should call us and we will give her a new prescription for the 290 mcg of Linzess.  If not we may want to switch over to Trulance and try that.  The patient has been explained the plan agrees with it.     Midge Minium, MD. Clementeen Graham    Note: This dictation was prepared with Dragon dictation along with smaller phrase technology. Any transcriptional errors that result from this process are unintentional.

## 2023-03-11 ENCOUNTER — Other Ambulatory Visit: Payer: Self-pay | Admitting: Gastroenterology

## 2023-03-12 DIAGNOSIS — Z5181 Encounter for therapeutic drug level monitoring: Secondary | ICD-10-CM | POA: Diagnosis not present

## 2023-03-17 DIAGNOSIS — F32A Depression, unspecified: Secondary | ICD-10-CM | POA: Diagnosis not present

## 2023-03-21 DIAGNOSIS — Z419 Encounter for procedure for purposes other than remedying health state, unspecified: Secondary | ICD-10-CM | POA: Diagnosis not present

## 2023-04-10 ENCOUNTER — Other Ambulatory Visit: Payer: Self-pay | Admitting: Gastroenterology

## 2023-04-14 DIAGNOSIS — F32A Depression, unspecified: Secondary | ICD-10-CM | POA: Diagnosis not present

## 2023-04-14 DIAGNOSIS — F411 Generalized anxiety disorder: Secondary | ICD-10-CM | POA: Diagnosis not present

## 2023-04-20 DIAGNOSIS — Z419 Encounter for procedure for purposes other than remedying health state, unspecified: Secondary | ICD-10-CM | POA: Diagnosis not present

## 2023-05-05 DIAGNOSIS — Z5181 Encounter for therapeutic drug level monitoring: Secondary | ICD-10-CM | POA: Diagnosis not present

## 2023-05-06 DIAGNOSIS — F900 Attention-deficit hyperactivity disorder, predominantly inattentive type: Secondary | ICD-10-CM | POA: Diagnosis not present

## 2023-05-06 DIAGNOSIS — F411 Generalized anxiety disorder: Secondary | ICD-10-CM | POA: Diagnosis not present

## 2023-05-13 DIAGNOSIS — F411 Generalized anxiety disorder: Secondary | ICD-10-CM | POA: Diagnosis not present

## 2023-05-13 DIAGNOSIS — F900 Attention-deficit hyperactivity disorder, predominantly inattentive type: Secondary | ICD-10-CM | POA: Diagnosis not present

## 2023-05-21 DIAGNOSIS — Z419 Encounter for procedure for purposes other than remedying health state, unspecified: Secondary | ICD-10-CM | POA: Diagnosis not present

## 2023-06-02 DIAGNOSIS — Z5181 Encounter for therapeutic drug level monitoring: Secondary | ICD-10-CM | POA: Diagnosis not present

## 2023-06-02 DIAGNOSIS — F411 Generalized anxiety disorder: Secondary | ICD-10-CM | POA: Diagnosis not present

## 2023-06-02 DIAGNOSIS — F900 Attention-deficit hyperactivity disorder, predominantly inattentive type: Secondary | ICD-10-CM | POA: Diagnosis not present

## 2023-06-02 DIAGNOSIS — F32A Depression, unspecified: Secondary | ICD-10-CM | POA: Diagnosis not present

## 2023-06-03 IMAGING — DX DG CHEST 2V
2 series · 2 of 2 positions shown · non-contrast
Comparison: None.

CLINICAL DATA: Cough

EXAM:
CHEST - 2 VIEW

[chest pa]
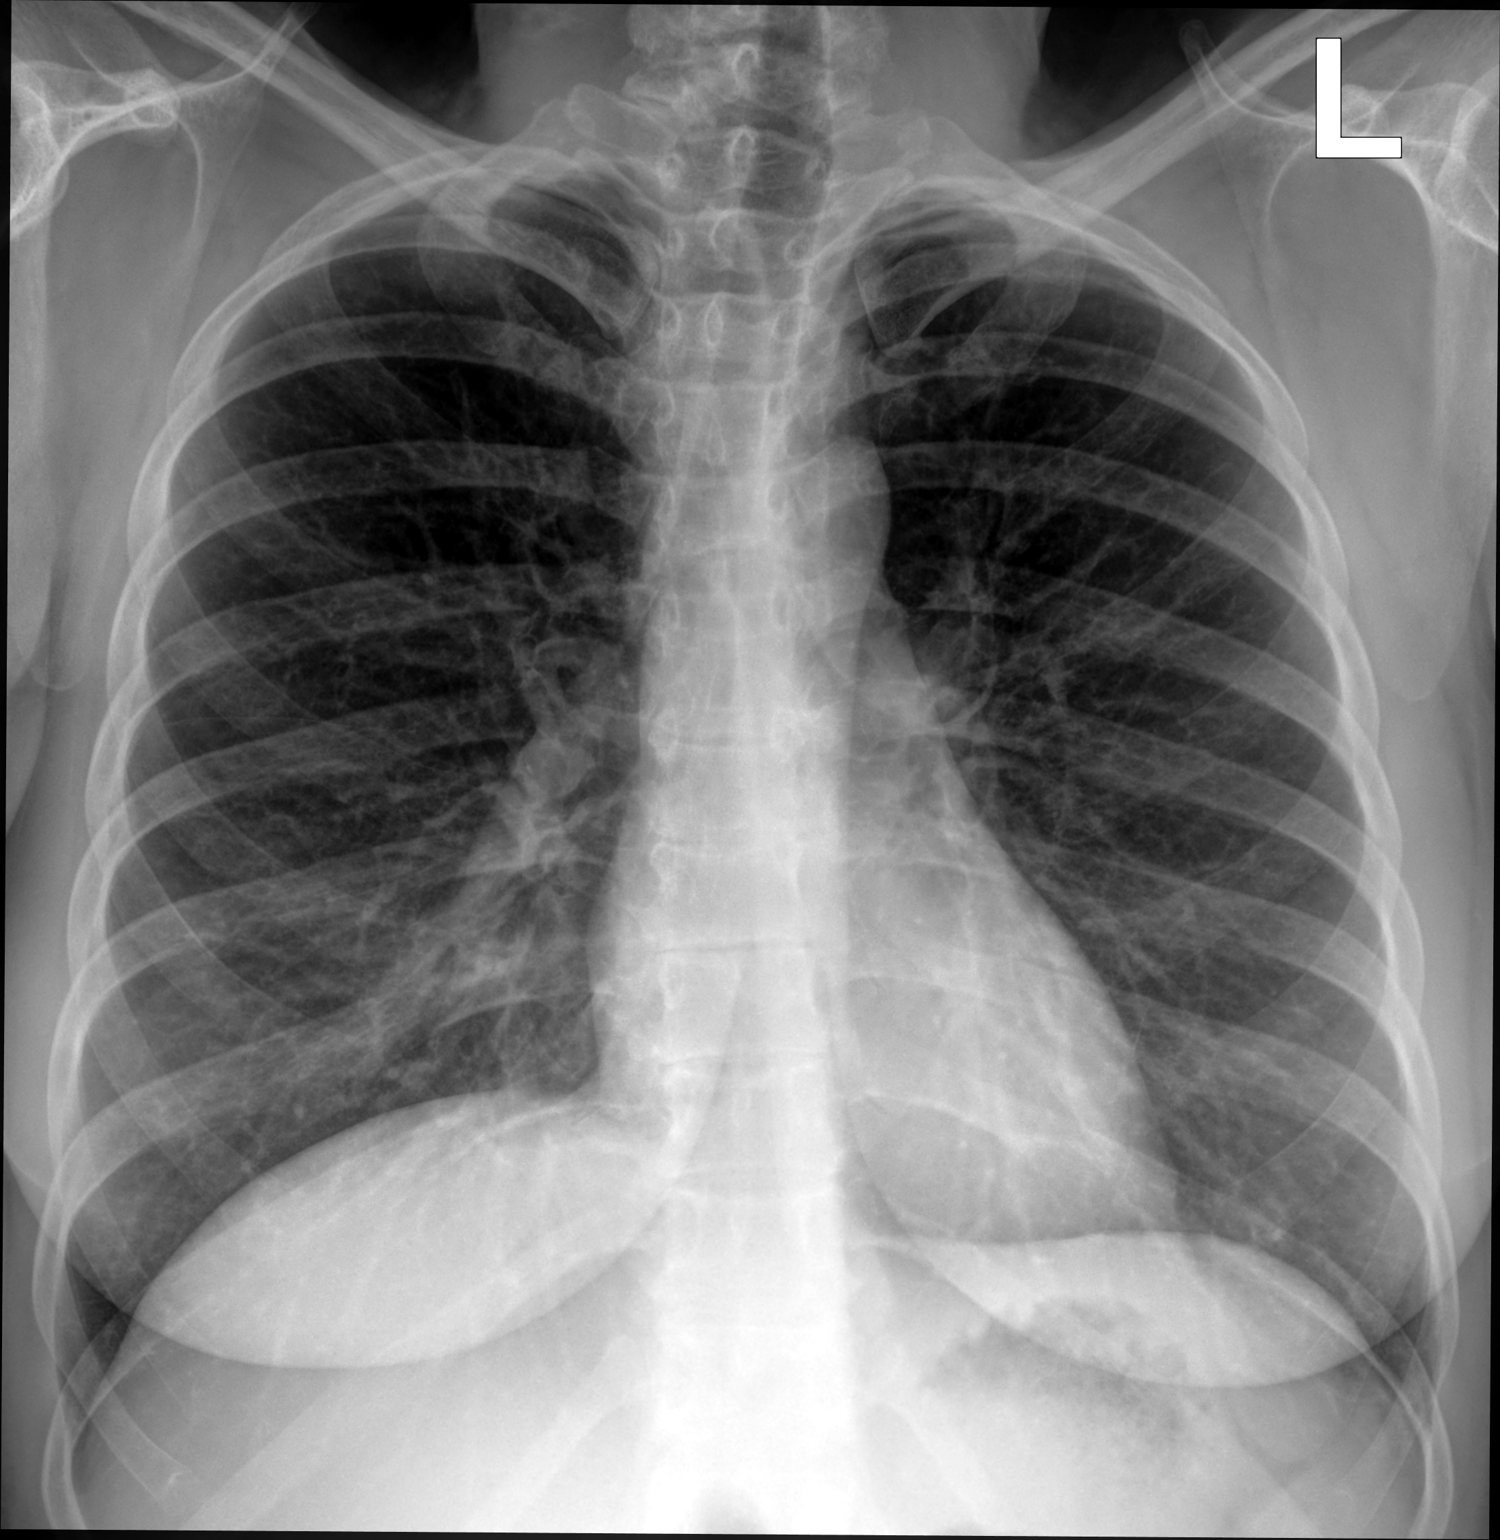

[chest lat]
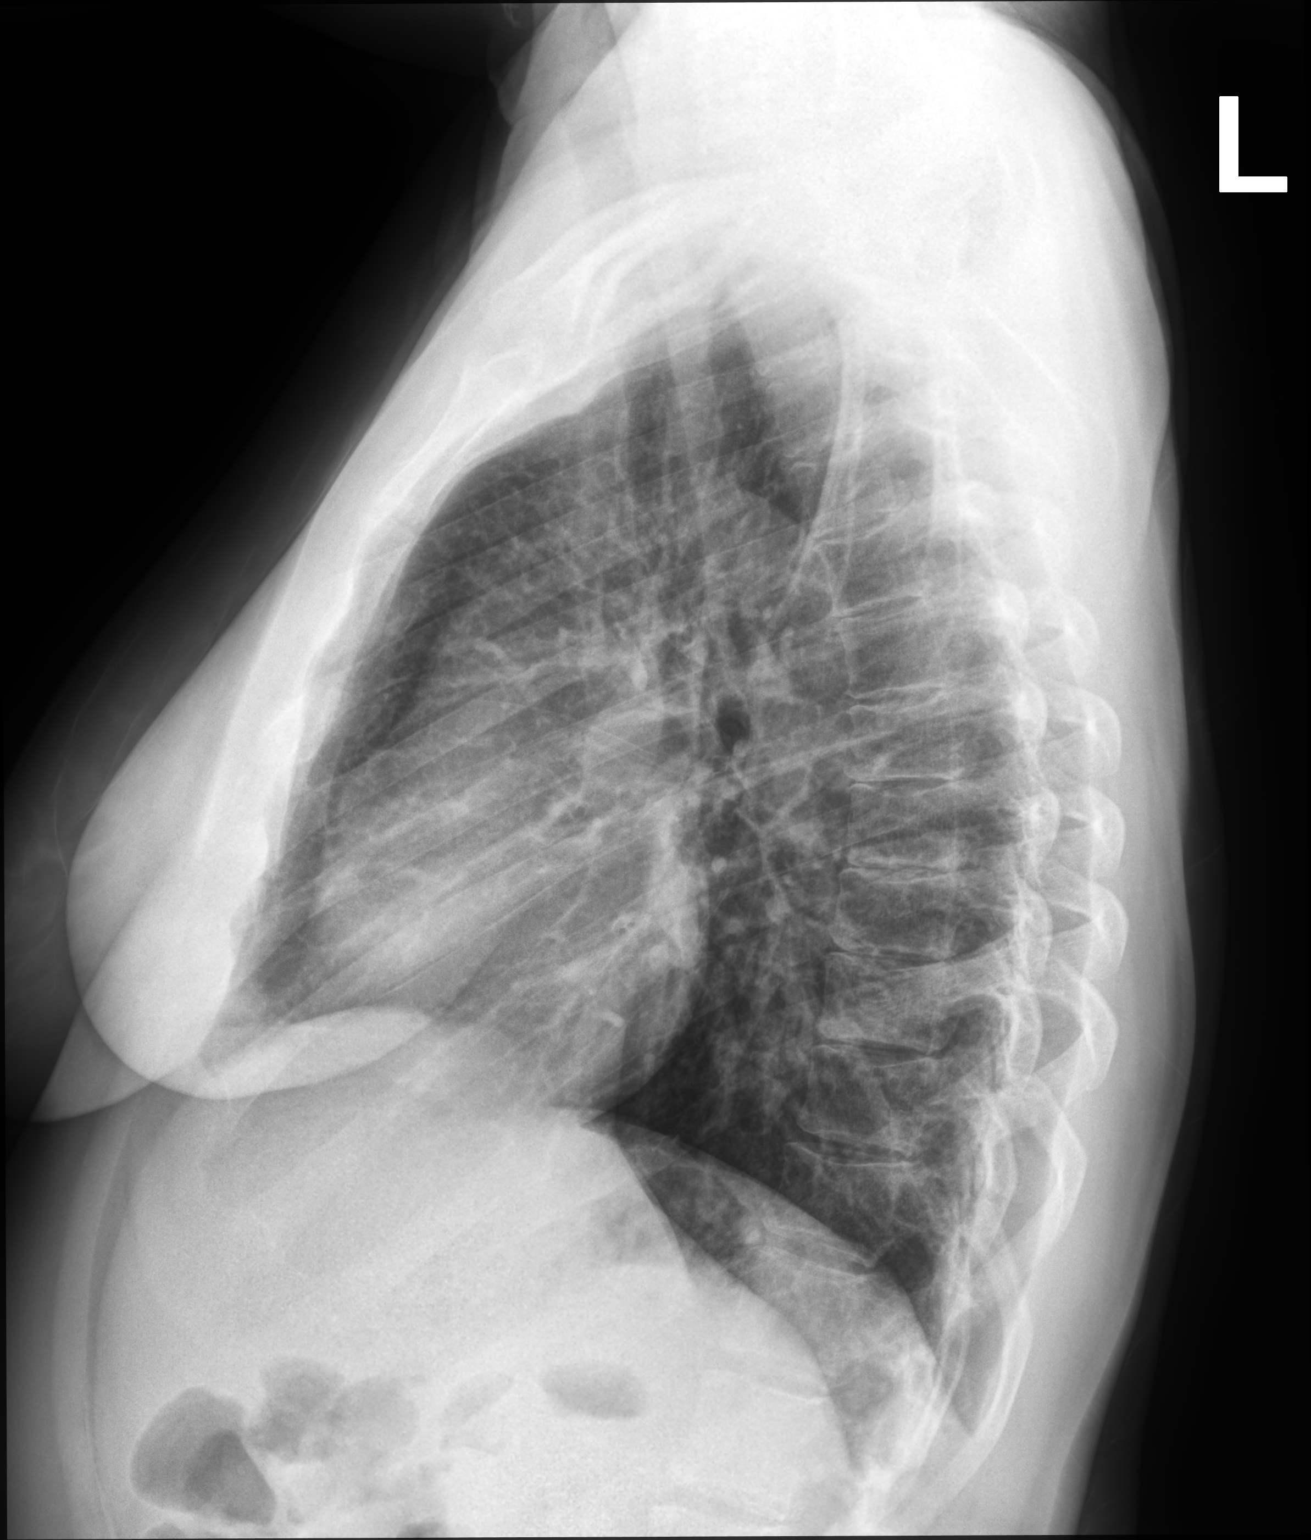

[2 of 2 positions shown; findings below may reference images not displayed]

FINDINGS: The heart size and mediastinal contours are within normal limits.No
focal airspace disease. No pleural effusion or pneumothorax.No acute
osseous abnormality.
IMPRESSION: No evidence of acute cardiopulmonary disease.

## 2023-06-09 DIAGNOSIS — F411 Generalized anxiety disorder: Secondary | ICD-10-CM | POA: Diagnosis not present

## 2023-06-09 DIAGNOSIS — F32A Depression, unspecified: Secondary | ICD-10-CM | POA: Diagnosis not present

## 2023-06-12 ENCOUNTER — Other Ambulatory Visit: Payer: Self-pay | Admitting: Gastroenterology

## 2023-06-20 DIAGNOSIS — Z419 Encounter for procedure for purposes other than remedying health state, unspecified: Secondary | ICD-10-CM | POA: Diagnosis not present

## 2023-06-30 DIAGNOSIS — F32A Depression, unspecified: Secondary | ICD-10-CM | POA: Diagnosis not present

## 2023-06-30 DIAGNOSIS — F900 Attention-deficit hyperactivity disorder, predominantly inattentive type: Secondary | ICD-10-CM | POA: Diagnosis not present

## 2023-06-30 DIAGNOSIS — F411 Generalized anxiety disorder: Secondary | ICD-10-CM | POA: Diagnosis not present

## 2023-06-30 DIAGNOSIS — Z5181 Encounter for therapeutic drug level monitoring: Secondary | ICD-10-CM | POA: Diagnosis not present

## 2023-07-07 ENCOUNTER — Encounter: Payer: Self-pay | Admitting: Dermatology

## 2023-07-07 ENCOUNTER — Ambulatory Visit: Payer: Medicaid Other | Admitting: Dermatology

## 2023-07-07 DIAGNOSIS — L918 Other hypertrophic disorders of the skin: Secondary | ICD-10-CM | POA: Diagnosis not present

## 2023-07-07 DIAGNOSIS — L578 Other skin changes due to chronic exposure to nonionizing radiation: Secondary | ICD-10-CM

## 2023-07-07 DIAGNOSIS — F900 Attention-deficit hyperactivity disorder, predominantly inattentive type: Secondary | ICD-10-CM | POA: Diagnosis not present

## 2023-07-07 DIAGNOSIS — L821 Other seborrheic keratosis: Secondary | ICD-10-CM | POA: Diagnosis not present

## 2023-07-07 DIAGNOSIS — W908XXA Exposure to other nonionizing radiation, initial encounter: Secondary | ICD-10-CM

## 2023-07-07 DIAGNOSIS — F32A Depression, unspecified: Secondary | ICD-10-CM | POA: Diagnosis not present

## 2023-07-07 DIAGNOSIS — L820 Inflamed seborrheic keratosis: Secondary | ICD-10-CM

## 2023-07-07 DIAGNOSIS — F411 Generalized anxiety disorder: Secondary | ICD-10-CM | POA: Diagnosis not present

## 2023-07-07 NOTE — Progress Notes (Signed)
° °  New Patient Visit   Subjective  Lori Lawson is a 42 y.o. female who presents for the following: Spots on face. Larger. Getting darker. Dur: ~10 years. Non tender, denies itching. Hx of tanning.   Lesion in groin area. Has gotten larger. Would like looked at to make sure it doesn't look like skin cancer.   The patient has spots, moles and lesions to be evaluated, some may be new or changing and the patient may have concern these could be cancer.    The following portions of the chart were reviewed this encounter and updated as appropriate: medications, allergies, medical history  Review of Systems:  No other skin or systemic complaints except as noted in HPI or Assessment and Plan.  Objective  Well appearing patient in no apparent distress; mood and affect are within normal limits.  A focused examination was performed of the following areas: Face, left thigh  Relevant physical exam findings are noted in the Assessment and Plan.   Left Cheek x1 Erythematous keratotic or waxy stuck-on papule or plaque.  Assessment & Plan   Acrochordon (Skin Tag) Groin Pt declines treatment - Fleshy, skin-colored pedunculated papule at left groin - Benign appearing.  - Observe. - If desired, they can be removed with an in office procedure that is not covered by insurance. - Please call the clinic if you notice any new or changing lesions.  SEBORRHEIC KERATOSIS - Stuck-on, waxy, tan-brown papules and/or plaques at face - Benign-appearing - Discussed benign etiology and prognosis. - Observe - Call for any changes  INFLAMED SEBORRHEIC KERATOSIS Left Cheek x1 Symptomatic, irritating, patient would like treated. Destruction of lesion - Left Cheek x1 Complexity: simple   Destruction method: cryotherapy   Informed consent: discussed and consent obtained   Timeout:  patient name, date of birth, surgical site, and procedure verified Lesion destroyed using liquid nitrogen: Yes   Region  frozen until ice ball extended beyond lesion: Yes   Outcome: patient tolerated procedure well with no complications   Post-procedure details: wound care instructions given   Additional details:  Prior to procedure, discussed risks of blister formation, small wound, skin dyspigmentation, or rare scar following cryotherapy. Recommend Vaseline ointment to treated areas while healing.   ACTINIC DAMAGE - chronic, secondary to cumulative UV radiation exposure/sun exposure over time - diffuse scaly erythematous macules with underlying dyspigmentation - Recommend daily broad spectrum sunscreen SPF 30+ to sun-exposed areas, reapply every 2 hours as needed.  - Recommend staying in the shade or wearing long sleeves, sun glasses (UVA+UVB protection) and wide brim hats (4-inch brim around the entire circumference of the hat). - Call for new or changing lesions.   Return if symptoms worsen or fail to improve.  I, Lawson Radar, CMA, am acting as scribe for Armida Sans, MD.   Documentation: I have reviewed the above documentation for accuracy and completeness, and I agree with the above.  Armida Sans, MD

## 2023-07-07 NOTE — Patient Instructions (Signed)
Cryotherapy Aftercare  Wash gently with soap and water everyday.   Apply Vaseline Jelly daily until healed.    Recommend daily broad spectrum sunscreen SPF 30+ to sun-exposed areas, reapply every 2 hours as needed. Call for new or changing lesions.  Staying in the shade or wearing long sleeves, sun glasses (UVA+UVB protection) and wide brim hats (4-inch brim around the entire circumference of the hat) are also recommended for sun protection.    Seborrheic Keratosis  What causes seborrheic keratoses? Seborrheic keratoses are harmless, common skin growths that first appear during adult life.  As time goes by, more growths appear.  Some people may develop a large number of them.  Seborrheic keratoses appear on both covered and uncovered body parts.  They are not caused by sunlight.  The tendency to develop seborrheic keratoses can be inherited.  They vary in color from skin-colored to gray, brown, or even black.  They can be either smooth or have a rough, warty surface.   Seborrheic keratoses are superficial and look as if they were stuck on the skin.  Under the microscope this type of keratosis looks like layers upon layers of skin.  That is why at times the top layer may seem to fall off, but the rest of the growth remains and re-grows.    Treatment Seborrheic keratoses do not need to be treated, but can easily be removed in the office.  Seborrheic keratoses often cause symptoms when they rub on clothing or jewelry.  Lesions can be in the way of shaving.  If they become inflamed, they can cause itching, soreness, or burning.  Removal of a seborrheic keratosis can be accomplished by freezing, burning, or surgery. If any spot bleeds, scabs, or grows rapidly, please return to have it checked, as these can be an indication of a skin cancer.  Due to recent changes in healthcare laws, you may see results of your pathology and/or laboratory studies on MyChart before the doctors have had a chance to  review them. We understand that in some cases there may be results that are confusing or concerning to you. Please understand that not all results are received at the same time and often the doctors may need to interpret multiple results in order to provide you with the best plan of care or course of treatment. Therefore, we ask that you please give [place] 2 business days to thoroughly review all your results before contacting the office for clarification. Should we see a critical lab result, you will be contacted sooner.   If You Need Anything After Your Visit  If you have any questions or concerns for your doctor, please call our main line at [phone] and press option 4 to reach your doctor's medical assistant. If no one answers, please leave a voicemail as directed and we will return your call as soon as possible. Messages left after 4 pm will be answered the following business day.   You may also send [place] a message via MyChart. We typically respond to MyChart messages within 1-2 business days.  For prescription refills, please ask your pharmacy to contact our office. Our fax number is [phone].  If you have an urgent issue when the clinic is closed that cannot wait until the next business day, you can page your doctor at the number below.    Please note that while we do our best to be available for urgent issues outside of office hours, we are not available 24/7.   If  you have an urgent issue and are unable to reach [place], you may choose to seek medical care at your doctor's office, retail clinic, urgent care center, or emergency room.  If you have a medical emergency, please immediately call 911 or go to the emergency department.  Pager Numbers  - Dr. Gwen Pounds: [phone]  - Dr. Roseanne Reno: [phone]  - Dr. Katrinka Blazing: [phone]   In the event of inclement weather, please call our main line at [phone] for an update on the status of any delays or closures.  Dermatology  Medication Tips: Please keep the boxes that topical medications come in in order to help keep track of the instructions about where and how to use these. Pharmacies typically print the medication instructions only on the boxes and not directly on the medication tubes.   If your medication is too expensive, please contact our office at [phone] option 4 or send [place] a message through MyChart.   We are unable to tell what your co-pay for medications will be in advance as this is different depending on your insurance coverage. However, we may be able to find a substitute medication at lower cost or fill out paperwork to get insurance to cover a needed medication.   If a prior authorization is required to get your medication covered by your insurance company, please allow [place] 1-2 business days to complete this process.  Drug prices often vary depending on where the prescription is filled and some pharmacies may offer cheaper prices.  The website www.goodrx.com contains coupons for medications through different pharmacies. The prices here do not account for what the cost may be with help from insurance (it may be cheaper with your insurance), but the website can give you the price if you did not use any insurance.  - You can print the associated coupon and take it with your prescription to the pharmacy.  - You may also stop by our office during regular business hours and pick up a GoodRx coupon card.  - If you need your prescription sent electronically to a different pharmacy, notify our office through [facility] or by phone at [phone] option 4.     Si Usted Necesita Algo Despus de Su Visita  Tambin puede enviarnos un mensaje a travs de Clinical cytogeneticist. Por lo general respondemos a los mensajes de MyChart en el transcurso de 1 a 2 das hbiles.  Para renovar recetas, por favor pida a su farmacia que se ponga en contacto con nuestra oficina. Annie Sable de fax es [place] [phone].  Si  tiene un asunto urgente cuando la clnica est cerrada y que no puede esperar hasta el siguiente da hbil, puede llamar/localizar a su doctor(a) al nmero que aparece a continuacin.   Por favor, tenga en cuenta que aunque hacemos todo lo posible para estar disponibles para asuntos urgentes fuera del horario de [place], no estamos disponibles las 24 horas del da, los 7 [address]  [address] de la [place].   Si tiene un problema urgente y no puede comunicarse con nosotros, puede optar por buscar atencin mdica  en el consultorio de su doctor(a), en una clnica privada, en un centro de atencin urgente o en una sala de emergencias.  Si tiene Engineer, drilling, por favor llame inmediatamente al 911 o vaya a la sala de emergencias.  Nmeros de bper  - Dr. Gwen Pounds: [phone]  - Dra. Roseanne Reno: [phone]  - Dr. Katrinka Blazing: [phone]   En caso de inclemencias del tiempo, por favor llame a nuestra lnea  principal al [phone] para una actualizacin sobre el [place] de cualquier retraso o cierre.  Consejos para la medicacin en dermatologa: Por favor, guarde las cajas en las que vienen los medicamentos de uso tpico para ayudarle a seguir las instrucciones sobre dnde y cmo usarlos. Las farmacias generalmente imprimen las instrucciones del medicamento slo en las cajas y no directamente en los tubos del [place].   Si su medicamento es muy caro, por favor, pngase en contacto con Rolm [place] llamando al [phone] y presione la opcin 4 o envenos un mensaje a travs de Clinical cytogeneticist.   No podemos decirle cul ser su copago por los medicamentos por adelantado ya que esto es diferente dependiendo de la cobertura de su seguro. Sin embargo, es posible que podamos encontrar un medicamento sustituto a Audiological scientist un formulario para que el seguro cubra el medicamento que se considera necesario.   Si se requiere una autorizacin previa para que su compaa de seguros [place] su medicamento, por  favor permtanos de 1 a 2 das hbiles para completar [address]  Los precios de los medicamentos varan con frecuencia dependiendo del Environmental consultant de dnde se surte la receta y alguna farmacias pueden ofrecer precios ms baratos.  El sitio web www.goodrx.com tiene cupones para medicamentos de Health and safety inspector. Los precios aqu no tienen en cuenta lo que podra costar con la ayuda del seguro (puede ser ms barato con su seguro), pero el sitio web puede darle el precio si no utiliz Tourist information centre manager.  - Puede imprimir el cupn correspondiente y llevarlo con su receta a la farmacia.  - Tambin puede pasar por nuestra oficina durante el horario de atencin regular y Education officer, museum una tarjeta de cupones de GoodRx.  - Si necesita que su receta se enve electrnicamente a una farmacia diferente, informe a nuestra oficina a travs de MyChart de [place] o por telfono llamando al [phone] y presione la opcin 4.

## 2023-07-18 ENCOUNTER — Encounter: Payer: Self-pay | Admitting: Dermatology

## 2023-07-21 DIAGNOSIS — Z419 Encounter for procedure for purposes other than remedying health state, unspecified: Secondary | ICD-10-CM | POA: Diagnosis not present

## 2023-07-26 ENCOUNTER — Encounter: Payer: Self-pay | Admitting: Internal Medicine

## 2023-07-26 ENCOUNTER — Ambulatory Visit (INDEPENDENT_AMBULATORY_CARE_PROVIDER_SITE_OTHER): Payer: Medicaid Other | Admitting: Internal Medicine

## 2023-07-26 VITALS — BP 104/68 | HR 74 | Ht 62.0 in | Wt 151.0 lb

## 2023-07-26 DIAGNOSIS — G43009 Migraine without aura, not intractable, without status migrainosus: Secondary | ICD-10-CM | POA: Diagnosis not present

## 2023-07-26 DIAGNOSIS — Z131 Encounter for screening for diabetes mellitus: Secondary | ICD-10-CM | POA: Diagnosis not present

## 2023-07-26 DIAGNOSIS — K5900 Constipation, unspecified: Secondary | ICD-10-CM | POA: Diagnosis not present

## 2023-07-26 DIAGNOSIS — E782 Mixed hyperlipidemia: Secondary | ICD-10-CM

## 2023-07-26 DIAGNOSIS — Z1231 Encounter for screening mammogram for malignant neoplasm of breast: Secondary | ICD-10-CM

## 2023-07-26 DIAGNOSIS — F331 Major depressive disorder, recurrent, moderate: Secondary | ICD-10-CM | POA: Diagnosis not present

## 2023-07-26 DIAGNOSIS — Z0000 Encounter for general adult medical examination without abnormal findings: Secondary | ICD-10-CM | POA: Diagnosis not present

## 2023-07-26 DIAGNOSIS — F324 Major depressive disorder, single episode, in partial remission: Secondary | ICD-10-CM

## 2023-07-26 NOTE — Assessment & Plan Note (Signed)
 Headaches are much improved since starting Topamax.

## 2023-07-26 NOTE — Assessment & Plan Note (Signed)
 On Linzess without much benefit. Also takes stool softener as needed.

## 2023-07-26 NOTE — Assessment & Plan Note (Signed)
 Doing well on multiple medications for ADHD, Depression, night terrors Followed by Psych

## 2023-07-26 NOTE — Patient Instructions (Signed)
 Call [facility] to schedule your mammogram at [phone].

## 2023-07-26 NOTE — Progress Notes (Signed)
 Date:  07/26/2023   Name:  Lori Lawson   DOB:  1981-02-19   MRN:  [id number]   Chief Complaint: Annual Exam Lori Lawson is a 43 y.o. female who presents today for her Complete Annual Exam. She feels well. She reports walking. She reports she is sleeping well. Breast complaints - none. She is on several new medications from psych for appetite suppression and migraine - Topamax and Fanapt.  Mammogram: 10/2022 DEXA: none Colonoscopy: none Pap: 11/2022 neg/neg  Health Maintenance Due  Topic Date Due   COVID-19 Vaccine (1) Never done   DTaP/Tdap/Td (1 - Tdap) Never done    Immunization History  Administered Date(s) Administered   Hep A / Hep B 12/09/2020, 01/15/2021    HPI  Review of Systems  Constitutional:  Negative for chills, fatigue and fever.  HENT:  Negative for congestion, hearing loss, tinnitus, trouble swallowing and voice change.   Eyes:  Negative for visual disturbance.  Respiratory:  Negative for cough, chest tightness, shortness of breath and wheezing.   Cardiovascular:  Negative for chest pain, palpitations and leg swelling.  Gastrointestinal:  Positive for constipation. Negative for abdominal pain, diarrhea and vomiting.  Endocrine: Negative for polydipsia and polyuria.  Genitourinary:  Negative for dysuria, frequency, genital sores, vaginal bleeding and vaginal discharge.  Musculoskeletal:  Negative for arthralgias, gait problem and joint swelling.  Skin:  Negative for color change and rash.  Neurological:  Negative for dizziness, tremors, light-headedness and headaches.  Hematological:  Negative for adenopathy. Does not bruise/bleed easily.  Psychiatric/Behavioral:  Negative for dysphoric mood and sleep disturbance. The patient is not nervous/anxious.      Lab Results  Component Value Date   NA 136 02/03/2023   K 4.3 02/03/2023   CO2 22 02/03/2023   GLUCOSE 98 02/03/2023   BUN 13 02/03/2023   CREATININE 0.79 02/03/2023   CALCIUM 9.4 02/03/2023    EGFR 96 02/03/2023   GFRNONAA >60 06/17/2020   Lab Results  Component Value Date   CHOL 202 (H) 07/23/2022   HDL 45 07/23/2022   LDLCALC 112 (H) 07/23/2022   TRIG 258 (H) 07/23/2022   CHOLHDL 4.5 (H) 07/23/2022   Lab Results  Component Value Date   TSH 1.400 02/03/2023   Lab Results  Component Value Date   HGBA1C 5.4 07/23/2022   Lab Results  Component Value Date   WBC 4.6 02/03/2023   HGB 12.0 02/03/2023   HCT 35.6 02/03/2023   MCV 88 02/03/2023   PLT 301 02/03/2023   Lab Results  Component Value Date   ALT 10 02/03/2023   AST 22 02/03/2023   ALKPHOS 107 02/03/2023   BILITOT 0.3 02/03/2023   No results found for: MARIEN BOLLS, VD25OH   Patient Active Problem List   Diagnosis Date Noted   Migraine without aura and without status migrainosus, not intractable 07/26/2023   Constipation in female 08/27/2021   Major depression single episode, in partial remission (HCC) 08/27/2021   Generalized anxiety disorder 08/27/2021   Hepatitis C virus infection without hepatic coma 09/25/2020    Allergies  Allergen Reactions   Suboxone [Buprenorphine Hcl-Naloxone Hcl] Anaphylaxis and Nausea And Vomiting    Past Surgical History:  Procedure Laterality Date   CESAREAN SECTION      Social History   Tobacco Use   Smoking status: Former    Current packs/day: 0.00    Types: Cigarettes    Quit date: 01/17/2021    Years since quitting: 2.5   Smokeless  tobacco: Never  Vaping Use   Vaping status: Every Day  Substance Use Topics   Alcohol use: Yes    Comment: occ.   Drug use: Yes    Types: Marijuana     Medication list has been reviewed and updated.  Current Meds  Medication Sig   busPIRone (BUSPAR) 15 MG tablet Take 15 mg by mouth in the morning and at bedtime.   FANAPT 2 MG TABS Take 2 mg by mouth daily at 6 (six) AM.   hydrOXYzine (VISTARIL) 25 MG capsule Take 25 mg by mouth 2 (two) times daily.   ibuprofen (ADVIL) 600 MG tablet Take 600 mg by  mouth as needed.   linaclotide  (LINZESS ) 290 MCG CAPS capsule TAKE 1 CAPSULE BY MOUTH EVERY DAY BEFORE BREAKFAST   Multiple Vitamin (MULTI-VITAMIN PO) Take by mouth daily.   naproxen  (NAPROSYN ) 500 MG tablet Take 1 tablet (500 mg total) by mouth 2 (two) times daily with a meal.   norethindrone-ethinyl estradiol-FE (JUNEL FE 1/20) 1-20 MG-MCG tablet TAKE 1 TABLET BY MOUTH EVERY DAY   OLANZapine (ZYPREXA) 7.5 MG tablet Take 7.5 mg by mouth at bedtime.   ondansetron  (ZOFRAN ) 4 MG tablet Take 1 tablet (4 mg total) by mouth every 8 (eight) hours as needed for nausea or vomiting.   pantoprazole  (PROTONIX ) 40 MG tablet TAKE 1 TABLET BY MOUTH EVERY DAY   QELBREE 200 MG 24 hr capsule Take 600 mg by mouth every morning.   sertraline (ZOLOFT) 100 MG tablet Take 200 mg by mouth daily. Dr.   Wynelle   SUBLOCADE 300 MG/1.5ML SOSY Inject into the skin.   topiramate (TOPAMAX) 200 MG tablet Take 200 mg by mouth at bedtime.   VYVANSE  60 MG capsule Take 60 mg by mouth every morning.   [DISCONTINUED] meclizine  (ANTIVERT ) 25 MG tablet Take 1 tablet (25 mg total) by mouth 3 (three) times daily as needed for dizziness.       07/26/2023    8:40 AM 02/03/2023    3:18 PM 11/04/2022    4:04 PM 07/23/2022   11:14 AM  GAD 7 : Generalized Anxiety Score  Nervous, Anxious, on Edge 0 3 3 2   Control/stop worrying 0 1 0 1  Worry too much - different things 0 1 2 1   Trouble relaxing 0 1 1 1   Restless 0 3 2 1   Easily annoyed or irritable 0 1 1 1   Afraid - awful might happen 0 0 0 0  Total GAD 7 Score 0 10 9 7   Anxiety Difficulty Not difficult at all Very difficult Somewhat difficult Somewhat difficult       07/26/2023    8:40 AM 02/03/2023    3:18 PM 11/04/2022    4:03 PM  Depression screen PHQ 2/9  Decreased Interest 0 1 1  Down, Depressed, Hopeless 0 0 0  PHQ - 2 Score 0 1 1  Altered sleeping 0 1 2  Tired, decreased energy 0 2 2  Change in appetite 0 1 0  Feeling bad or failure about yourself  0 0 0  Trouble  concentrating 0 3 3  Moving slowly or fidgety/restless 0 3 2  Suicidal thoughts 0 0 0  PHQ-9 Score 0 11 10  Difficult doing work/chores Not difficult at all Very difficult Very difficult    BP Readings from Last 3 Encounters:  07/26/23 104/68  03/01/23 (!) 139/97  02/03/23 128/74    Physical Exam Vitals and nursing note reviewed.  Constitutional:  General: She is not in acute distress.    Appearance: She is well-developed.  HENT:     Head: Normocephalic and atraumatic.     Right Ear: Tympanic membrane and ear canal normal.     Left Ear: Tympanic membrane and ear canal normal.     Nose:     Right Sinus: No maxillary sinus tenderness.     Left Sinus: No maxillary sinus tenderness.  Eyes:     General: No scleral icterus.       Right eye: No discharge.        Left eye: No discharge.     Conjunctiva/sclera: Conjunctivae normal.  Neck:     Thyroid : No thyromegaly.     Vascular: No carotid bruit.  Cardiovascular:     Rate and Rhythm: Normal rate and regular rhythm.     Pulses: Normal pulses.     Heart sounds: Normal heart sounds.  Pulmonary:     Effort: Pulmonary effort is normal. No respiratory distress.     Breath sounds: No wheezing.  Chest:  Breasts:    Right: No mass, nipple discharge, skin change or tenderness.     Left: No mass, nipple discharge, skin change or tenderness.  Abdominal:     General: Bowel sounds are normal.     Palpations: Abdomen is soft.     Tenderness: There is no abdominal tenderness.  Musculoskeletal:     Cervical back: Normal range of motion. No erythema.     Right lower leg: No edema.     Left lower leg: No edema.  Lymphadenopathy:     Cervical: No cervical adenopathy.  Skin:    General: Skin is warm and dry.     Findings: No rash.  Neurological:     Mental Status: She is alert and oriented to person, place, and time.     Cranial Nerves: No cranial nerve deficit.     Sensory: No sensory deficit.     Deep Tendon Reflexes: Reflexes  are normal and symmetric.  Psychiatric:        Attention and Perception: Attention normal.        Mood and Affect: Mood normal.     Wt Readings from Last 3 Encounters:  07/26/23 151 lb (68.5 kg)  03/01/23 178 lb (80.7 kg)  02/03/23 177 lb (80.3 kg)    BP 104/68   Pulse 74   Ht 5' 2 (1.575 m)   Wt 151 lb (68.5 kg)   SpO2 100%   BMI 27.62 kg/m   Assessment and Plan:  Problem List Items Addressed This Visit       Unprioritized   Constipation in female   On Linzess  without much benefit. Also takes stool softener as needed.      Major depression single episode, in partial remission (HCC) (Chronic)   Doing well on multiple medications for ADHD, Depression, night terrors Followed by Psych      Relevant Medications   hydrOXYzine (VISTARIL) 25 MG capsule   Migraine without aura and without status migrainosus, not intractable   Headaches are much improved since starting Topamax.      Relevant Medications   topiramate (TOPAMAX) 200 MG tablet   Other Visit Diagnoses       Annual physical exam    -  Primary   Relevant Orders   CBC with Differential/Platelet   Comprehensive metabolic panel   Lipid panel   TSH   Hemoglobin A1c     Mixed hyperlipidemia  Relevant Orders   Lipid panel     Screening for diabetes mellitus       Relevant Orders   Hemoglobin A1c     Encounter for screening mammogram for breast cancer       Relevant Orders   MM 3D SCREENING MAMMOGRAM BILATERAL BREAST     Moderate episode of recurrent major depressive disorder (HCC)   (Chronic)     Relevant Medications   hydrOXYzine (VISTARIL) 25 MG capsule       No follow-ups on file.    Leita HILARIO Adie, MD [facility] [place]

## 2023-07-27 LAB — CBC WITH DIFFERENTIAL/PLATELET
Basophils Absolute: 0 10*3/uL (ref 0.0–0.2)
Basos: 0 %
EOS (ABSOLUTE): 0.1 10*3/uL (ref 0.0–0.4)
Eos: 3 %
Hematocrit: 34.8 % (ref 34.0–46.6)
Hemoglobin: 11.4 g/dL (ref 11.1–15.9)
Immature Grans (Abs): 0 10*3/uL (ref 0.0–0.1)
Immature Granulocytes: 0 %
Lymphocytes Absolute: 1.8 10*3/uL (ref 0.7–3.1)
Lymphs: 46 %
MCH: 28.4 pg (ref 26.6–33.0)
MCHC: 32.8 g/dL (ref 31.5–35.7)
MCV: 87 fL (ref 79–97)
Monocytes Absolute: 0.4 10*3/uL (ref 0.1–0.9)
Monocytes: 11 %
Neutrophils Absolute: 1.5 10*3/uL (ref 1.4–7.0)
Neutrophils: 40 %
Platelets: 269 10*3/uL (ref 150–450)
RBC: 4.01 x10E6/uL (ref 3.77–5.28)
RDW: 13.9 % (ref 11.7–15.4)
WBC: 3.8 10*3/uL (ref 3.4–10.8)

## 2023-07-27 LAB — COMPREHENSIVE METABOLIC PANEL
ALT: 15 [IU]/L (ref 0–32)
AST: 20 [IU]/L (ref 0–40)
Albumin: 4.4 g/dL (ref 3.9–4.9)
Alkaline Phosphatase: 85 [IU]/L (ref 44–121)
BUN/Creatinine Ratio: 12 (ref 9–23)
BUN: 11 mg/dL (ref 6–24)
Bilirubin Total: <0.2 mg/dL (ref 0.0–1.2)
CO2: 18 mmol/L — ABNORMAL LOW (ref 20–29)
Calcium: 9.6 mg/dL (ref 8.7–10.2)
Chloride: 108 mmol/L — ABNORMAL HIGH (ref 96–106)
Creatinine, Ser: 0.92 mg/dL (ref 0.57–1.00)
Globulin, Total: 2.5 g/dL (ref 1.5–4.5)
Glucose: 71 mg/dL (ref 70–99)
Potassium: 4.3 mmol/L (ref 3.5–5.2)
Sodium: 140 mmol/L (ref 134–144)
Total Protein: 6.9 g/dL (ref 6.0–8.5)
eGFR: 80 mL/min/{1.73_m2} (ref >59)

## 2023-07-27 LAB — HEMOGLOBIN A1C
Est. average glucose Bld gHb Est-mCnc: 114 mg/dL
Hgb A1c MFr Bld: 5.6 % (ref 4.8–5.6)

## 2023-07-27 LAB — LIPID PANEL
Chol/HDL Ratio: 5.6 {ratio} — ABNORMAL HIGH (ref 0.0–4.4)
Cholesterol, Total: 173 mg/dL (ref 100–199)
HDL: 31 mg/dL — ABNORMAL LOW (ref >39)
LDL Chol Calc (NIH): 93 mg/dL (ref 0–99)
Triglycerides: 292 mg/dL — ABNORMAL HIGH (ref 0–149)
VLDL Cholesterol Cal: 49 mg/dL — ABNORMAL HIGH (ref 5–40)

## 2023-07-27 LAB — TSH: TSH: 3.75 u[IU]/mL (ref 0.450–4.500)

## 2023-07-28 DIAGNOSIS — F900 Attention-deficit hyperactivity disorder, predominantly inattentive type: Secondary | ICD-10-CM | POA: Diagnosis not present

## 2023-07-28 DIAGNOSIS — F411 Generalized anxiety disorder: Secondary | ICD-10-CM | POA: Diagnosis not present

## 2023-07-28 DIAGNOSIS — Z5181 Encounter for therapeutic drug level monitoring: Secondary | ICD-10-CM | POA: Diagnosis not present

## 2023-08-04 DIAGNOSIS — F411 Generalized anxiety disorder: Secondary | ICD-10-CM | POA: Diagnosis not present

## 2023-08-04 DIAGNOSIS — F900 Attention-deficit hyperactivity disorder, predominantly inattentive type: Secondary | ICD-10-CM | POA: Diagnosis not present

## 2023-08-04 DIAGNOSIS — F32A Depression, unspecified: Secondary | ICD-10-CM | POA: Diagnosis not present

## 2023-08-17 ENCOUNTER — Telehealth: Payer: Medicaid Other | Admitting: Physician Assistant

## 2023-08-17 DIAGNOSIS — R3989 Other symptoms and signs involving the genitourinary system: Secondary | ICD-10-CM | POA: Diagnosis not present

## 2023-08-17 MED ORDER — CEPHALEXIN 500 MG PO CAPS: 500.0000 mg | ORAL_CAPSULE | Freq: Two times a day (BID) | ORAL | 0 refills | Status: AC

## 2023-08-17 NOTE — Progress Notes (Signed)

## 2023-08-17 NOTE — Progress Notes (Signed)
I have spent 5 minutes in review of e-visit questionnaire, review and updating patient chart, medical decision making and response to patient.   Piedad Climes, PA-C

## 2023-08-21 DIAGNOSIS — Z419 Encounter for procedure for purposes other than remedying health state, unspecified: Secondary | ICD-10-CM | POA: Diagnosis not present

## 2023-08-25 DIAGNOSIS — F900 Attention-deficit hyperactivity disorder, predominantly inattentive type: Secondary | ICD-10-CM | POA: Diagnosis not present

## 2023-08-25 DIAGNOSIS — F411 Generalized anxiety disorder: Secondary | ICD-10-CM | POA: Diagnosis not present

## 2023-08-25 DIAGNOSIS — Z5181 Encounter for therapeutic drug level monitoring: Secondary | ICD-10-CM | POA: Diagnosis not present

## 2023-08-31 ENCOUNTER — Telehealth: Payer: Medicaid Other | Admitting: Nurse Practitioner

## 2023-08-31 ENCOUNTER — Ambulatory Visit: Payer: Self-pay

## 2023-08-31 DIAGNOSIS — F411 Generalized anxiety disorder: Secondary | ICD-10-CM | POA: Diagnosis not present

## 2023-08-31 DIAGNOSIS — R3989 Other symptoms and signs involving the genitourinary system: Secondary | ICD-10-CM

## 2023-08-31 DIAGNOSIS — F900 Attention-deficit hyperactivity disorder, predominantly inattentive type: Secondary | ICD-10-CM | POA: Diagnosis not present

## 2023-08-31 DIAGNOSIS — F32A Depression, unspecified: Secondary | ICD-10-CM | POA: Diagnosis not present

## 2023-08-31 NOTE — Progress Notes (Signed)
Because you have been treated virtually for a UTI in the past month , I feel your condition warrants further evaluation and I recommend that you be seen for a face to face visit.  Please contact your primary care physician practice to be seen. Many offices offer virtual options to be seen via video if you are not comfortable going in person to a medical facility at this time.  NOTE: You will NOT be charged for this eVisit.  If you do not have a PCP, [facility] offers a free physician referral service available at [phone]. Our trained staff has the experience, knowledge and resources to put you in touch with a physician who is right for you.    If you are having a true medical emergency please call 911.   Your e-visit answers were reviewed by a board certified advanced clinical practitioner to complete your personal care plan.  Thank you for using e-Visits.

## 2023-08-31 NOTE — Telephone Encounter (Signed)
  Chief Complaint: recurrent UTI Symptoms: urinary frequency, urgency, dysuria Frequency: had 2 weeks ago, was on Keflex x 7 days and sx started back today  Pertinent Negatives: NA Disposition: [] ED /[] Urgent Care (no appt availability in office) / [x] Appointment(In office/virtual)/ []  [facility]/ [] Home Care/ [] Refused Recommended Disposition /[] [facility]/ []  Follow-up with PCP Additional Notes: pt had Evisit, was given Keflex on 08/17/23, completed and sx resolved for short time. Sx back starting today. Pt has taken some AZO OTC for sx. Pt did another Evisit today but was advised to be seen in person. Scheduled OV tomorrow at 1020 with PCP. Care advice given and pt verbalized understanding.   Summary: UTI   Pt is calling in because she was treated for a UTI and the symptoms are back. Pt says she was given the antibiotic Cephalexin and she finished the medication, but it has not helped. Pt wanted an appointment with her provider but there are no available appointments until March.         Reason for Disposition  [1] Taking antibiotic > 72 hours (3 days) for UTI AND [2] painful urination or frequency is SAME (unchanged, not better)  Answer Assessment - Initial Assessment Questions 1. MAIN SYMPTOM: "What is the main symptom you are concerned about?" (e.g., painful urination, urine frequency)     Sx back from previous UTI 2. BETTER-SAME-WORSE: "Are you getting better, staying the same, or getting worse compared to how you felt at your last visit to the doctor (most recent medical visit)?"     worse 3. PAIN: "How bad is the pain?"  (e.g., Scale 1-10; mild, moderate, or severe)   - MILD (1-3): complains slightly about urination hurting   - MODERATE (4-7): interferes with normal activities     - SEVERE (8-10): excruciating, unwilling or unable to urinate because of the pain      Moderate  5. OTHER SYMPTOMS: "Do you have any other symptoms?" (e.g., blood in the urine,  flank pain, vaginal discharge)     Urgency, frequency, discomfort and dysuria 6. DIAGNOSIS: "When was the UTI diagnosed?" "By whom?" "Was it a kidney infection, bladder infection or both?"     Evisit 08/17/23 7. ANTIBIOTIC: "What antibiotic(s) are you taking?" "How many times per day?"     Completed Keflex x 7 days on 08/24/23 8. ANTIBIOTIC - START DATE: "When did you start taking the antibiotic?"     08/17/23  Protocols used: Urinary Tract Infection on Antibiotic Follow-up Call - [facility]

## 2023-09-01 ENCOUNTER — Encounter: Payer: Self-pay | Admitting: Internal Medicine

## 2023-09-01 ENCOUNTER — Ambulatory Visit: Payer: Medicaid Other | Admitting: Internal Medicine

## 2023-09-01 ENCOUNTER — Other Ambulatory Visit: Payer: Self-pay | Admitting: Internal Medicine

## 2023-09-01 VITALS — BP 100/74 | HR 94 | Ht 62.0 in | Wt 145.0 lb

## 2023-09-01 DIAGNOSIS — R399 Unspecified symptoms and signs involving the genitourinary system: Secondary | ICD-10-CM

## 2023-09-01 MED ORDER — SULFAMETHOXAZOLE-TRIMETHOPRIM 800-160 MG PO TABS: 1.0000 | ORAL_TABLET | Freq: Two times a day (BID) | ORAL | 0 refills | Status: AC

## 2023-09-01 NOTE — Progress Notes (Signed)
Date:  09/01/2023   Name:  Lori Lawson   DOB:  Nov 20, 1980   MRN:  [id number]   Chief Complaint: Urinary Tract Infection  Urinary Tract Infection  This is a recurrent problem. The current episode started yesterday. The problem occurs every urination. The quality of the pain is described as burning. The pain is at a severity of 6/10. The pain is mild. There has been no fever. She is Sexually active. There is A history of pyelonephritis. Associated symptoms include frequency and urgency. Pertinent negatives include no chills, hematuria or vomiting. Treatments tried: azo. The treatment provided mild relief.  She had a virtual visit last month for UTI and was given Keflex.  Symptoms resolved but then returned yesterday.  She tried a virtual visit but was told that she needed to be seen in person.  She took AZO last evening to reduce discomfort.  Review of Systems  Constitutional:  Negative for chills, fatigue and fever.  Respiratory:  Negative for chest tightness and shortness of breath.   Cardiovascular:  Negative for chest pain.  Gastrointestinal:  Negative for abdominal pain, diarrhea and vomiting.  Genitourinary:  Positive for dysuria, frequency and urgency. Negative for hematuria.     Lab Results  Component Value Date   NA 140 07/26/2023   K 4.3 07/26/2023   CO2 18 (L) 07/26/2023   GLUCOSE 71 07/26/2023   BUN 11 07/26/2023   CREATININE 0.92 07/26/2023   CALCIUM 9.6 07/26/2023   EGFR 80 07/26/2023   GFRNONAA >60 06/17/2020   Lab Results  Component Value Date   CHOL 173 07/26/2023   HDL 31 (L) 07/26/2023   LDLCALC 93 07/26/2023   TRIG 292 (H) 07/26/2023   CHOLHDL 5.6 (H) 07/26/2023   Lab Results  Component Value Date   TSH 3.750 07/26/2023   Lab Results  Component Value Date   HGBA1C 5.6 07/26/2023   Lab Results  Component Value Date   WBC 3.8 07/26/2023   HGB 11.4 07/26/2023   HCT 34.8 07/26/2023   MCV 87 07/26/2023   PLT 269 07/26/2023   Lab Results   Component Value Date   ALT 15 07/26/2023   AST 20 07/26/2023   ALKPHOS 85 07/26/2023   BILITOT <0.2 07/26/2023   No results found for: "25OHVITD2", "25OHVITD3", "VD25OH"   Patient Active Problem List   Diagnosis Date Noted   Migraine without aura and without status migrainosus, not intractable 07/26/2023   Constipation in female 08/27/2021   Major depression single episode, in partial remission (HCC) 08/27/2021   Generalized anxiety disorder 08/27/2021   Hepatitis C virus infection without hepatic coma 09/25/2020    Allergies  Allergen Reactions   Suboxone [Buprenorphine Hcl-Naloxone Hcl] Anaphylaxis and Nausea And Vomiting    Past Surgical History:  Procedure Laterality Date   CESAREAN SECTION      Social History   Tobacco Use   Smoking status: Former    Current packs/day: 0.00    Types: Cigarettes    Quit date: 01/17/2021    Years since quitting: 2.6   Smokeless tobacco: Never  Vaping Use   Vaping status: Every Day  Substance Use Topics   Alcohol use: Yes    Comment: occ.   Drug use: Yes    Types: Marijuana     Medication list has been reviewed and updated.  Current Meds  Medication Sig   sulfamethoxazole-trimethoprim (BACTRIM DS) 800-160 MG tablet Take 1 tablet by mouth 2 (two) times daily for 5 days.  09/01/2023   10:21 AM 07/26/2023    8:40 AM 02/03/2023    3:18 PM 11/04/2022    4:04 PM  GAD 7 : Generalized Anxiety Score  Nervous, Anxious, on Edge 0 0 3 3  Control/stop worrying 0 0 1 0  Worry too much - different things 0 0 1 2  Trouble relaxing 0 0 1 1  Restless 0 0 3 2  Easily annoyed or irritable 0 0 1 1  Afraid - awful might happen 0 0 0 0  Total GAD 7 Score 0 0 10 9  Anxiety Difficulty Not difficult at all Not difficult at all Very difficult Somewhat difficult       09/01/2023   10:21 AM 07/26/2023    8:40 AM 02/03/2023    3:18 PM  Depression screen PHQ 2/9  Decreased Interest 0 0 1  Down, Depressed, Hopeless 0 0 0  PHQ - 2 Score 0  0 1  Altered sleeping 1 0 1  Tired, decreased energy 1 0 2  Change in appetite 0 0 1  Feeling bad or failure about yourself  0 0 0  Trouble concentrating 2 0 3  Moving slowly or fidgety/restless 1 0 3  Suicidal thoughts 0 0 0  PHQ-9 Score 5 0 11  Difficult doing work/chores Somewhat difficult Not difficult at all Very difficult    BP Readings from Last 3 Encounters:  09/01/23 100/74  07/26/23 104/68  03/01/23 (!) 139/97    Physical Exam Vitals and nursing note reviewed.  Constitutional:      Appearance: She is well-developed.  Cardiovascular:     Rate and Rhythm: Normal rate and regular rhythm.     Heart sounds: Normal heart sounds.  Pulmonary:     Effort: Pulmonary effort is normal. No respiratory distress.     Breath sounds: Normal breath sounds.  Abdominal:     General: Bowel sounds are normal.     Palpations: Abdomen is soft.     Tenderness: There is abdominal tenderness in the suprapubic area. There is no right CVA tenderness, left CVA tenderness, guarding or rebound.     Wt Readings from Last 3 Encounters:  09/01/23 145 lb (65.8 kg)  07/26/23 151 lb (68.5 kg)  03/01/23 178 lb (80.7 kg)    BP 100/74   Pulse 94   Ht 5\' 2"  (1.575 m)   Wt 145 lb (65.8 kg)   SpO2 96%   BMI 26.52 kg/m   Assessment and Plan:  Problem List Items Addressed This Visit   None Visit Diagnoses       UTI symptoms    -  Primary   consistent with recurrent infection can not do UA dip due to AZO so will send for cx treat empirically with Bactrim and adjust per culture   Relevant Medications   sulfamethoxazole-trimethoprim (BACTRIM DS) 800-160 MG tablet   Other Relevant Orders   Urine Culture       No follow-ups on file.    Reubin Milan, MD [facility] [place]

## 2023-09-03 ENCOUNTER — Encounter: Payer: Self-pay | Admitting: Internal Medicine

## 2023-09-03 LAB — URINE CULTURE

## 2023-09-18 DIAGNOSIS — Z419 Encounter for procedure for purposes other than remedying health state, unspecified: Secondary | ICD-10-CM | POA: Diagnosis not present

## 2023-09-22 DIAGNOSIS — F411 Generalized anxiety disorder: Secondary | ICD-10-CM | POA: Diagnosis not present

## 2023-09-22 DIAGNOSIS — Z5181 Encounter for therapeutic drug level monitoring: Secondary | ICD-10-CM | POA: Diagnosis not present

## 2023-09-22 DIAGNOSIS — F900 Attention-deficit hyperactivity disorder, predominantly inattentive type: Secondary | ICD-10-CM | POA: Diagnosis not present

## 2023-09-22 DIAGNOSIS — F32A Depression, unspecified: Secondary | ICD-10-CM | POA: Diagnosis not present

## 2023-09-27 DIAGNOSIS — F4323 Adjustment disorder with mixed anxiety and depressed mood: Secondary | ICD-10-CM | POA: Diagnosis not present

## 2023-09-30 DIAGNOSIS — F900 Attention-deficit hyperactivity disorder, predominantly inattentive type: Secondary | ICD-10-CM | POA: Diagnosis not present

## 2023-09-30 DIAGNOSIS — F32A Depression, unspecified: Secondary | ICD-10-CM | POA: Diagnosis not present

## 2023-09-30 DIAGNOSIS — F411 Generalized anxiety disorder: Secondary | ICD-10-CM | POA: Diagnosis not present

## 2023-10-09 ENCOUNTER — Telehealth: Admitting: Nurse Practitioner

## 2023-10-09 DIAGNOSIS — R399 Unspecified symptoms and signs involving the genitourinary system: Secondary | ICD-10-CM

## 2023-10-09 MED ORDER — NITROFURANTOIN MONOHYD MACRO 100 MG PO CAPS: 100.0000 mg | ORAL_CAPSULE | Freq: Two times a day (BID) | ORAL | 0 refills | Status: AC

## 2023-10-09 NOTE — Progress Notes (Signed)
 I have spent 5 minutes in review of e-visit questionnaire, review and updating patient chart, medical decision making and response to patient.   Claiborne Rigg, NP

## 2023-10-09 NOTE — Progress Notes (Signed)
 As this will be the 3rd time you have been treated with abx within 3 months. After this treatment you would need to actually have your urine tested in the office for future treatments. It may be a gyn issue or urology issue if you continue to have symptoms of UTI. We do not actually have a positive culture of any bacteria so technically you do not have a forma; diagnosis of UTI at this time. I have sent an antibiotic to your pharmacy today. I recommend for future symptoms to not take azo until you can be seen by your PCP or urgent care as this will invalidate a urine sample.   E-Visit for Urinary Problems  We are sorry that you are not feeling well.  Here is how we plan to help!  Based on what you shared with me it looks like you most likely have a simple urinary tract infection.  A UTI (Urinary Tract Infection) is a bacterial infection of the bladder.  Most cases of urinary tract infections are simple to treat but a key part of your care is to encourage you to drink plenty of fluids and watch your symptoms carefully.  I have prescribed MacroBid 100 mg twice a day for 5 days.  Your symptoms should gradually improve. Call us if the burning in your urine worsens, you develop worsening fever, back pain or pelvic pain or if your symptoms do not resolve after completing the antibiotic.  Urinary tract infections can be prevented by drinking plenty of water to keep your body hydrated.  Also be sure when you wipe, wipe from front to back and don't hold it in!  If possible, empty your bladder every 4 hours.  HOME CARE Drink plenty of fluids Compete the full course of the antibiotics even if the symptoms resolve Remember, when you need to go.go. Holding in your urine can increase the likelihood of getting a UTI! GET HELP RIGHT AWAY IF: You cannot urinate You get a high fever Worsening back pain occurs You see blood in your urine You feel sick to your stomach or throw up You feel like you are going to  pass out  MAKE SURE YOU  Understand these instructions. Will watch your condition. Will get help right away if you are not doing well or get worse.   Thank you for choosing an e-visit.  Your e-visit answers were reviewed by a board certified advanced clinical practitioner to complete your personal care plan. Depending upon the condition, your plan could have included both over the counter or prescription medications.  Please review your pharmacy choice. Make sure the pharmacy is open so you can pick up prescription now. If there is a problem, you may contact your provider through [employer] and have the prescription routed to another pharmacy.  Your safety is important to [place]. If you have drug allergies check your prescription carefully.   For the next 24 hours you can use MyChart to ask questions about today's visit, request a non-urgent call back, or ask for a work or school excuse. You will get an email in the next two days asking about your experience. I hope that your e-visit has been valuable and will speed your recovery.

## 2023-10-11 DIAGNOSIS — F4323 Adjustment disorder with mixed anxiety and depressed mood: Secondary | ICD-10-CM | POA: Diagnosis not present

## 2023-10-18 ENCOUNTER — Encounter: Payer: Self-pay | Admitting: Internal Medicine

## 2023-10-18 DIAGNOSIS — F4323 Adjustment disorder with mixed anxiety and depressed mood: Secondary | ICD-10-CM | POA: Diagnosis not present

## 2023-10-20 DIAGNOSIS — F900 Attention-deficit hyperactivity disorder, predominantly inattentive type: Secondary | ICD-10-CM | POA: Diagnosis not present

## 2023-10-20 DIAGNOSIS — F32A Depression, unspecified: Secondary | ICD-10-CM | POA: Diagnosis not present

## 2023-10-20 DIAGNOSIS — F411 Generalized anxiety disorder: Secondary | ICD-10-CM | POA: Diagnosis not present

## 2023-10-20 DIAGNOSIS — Z5181 Encounter for therapeutic drug level monitoring: Secondary | ICD-10-CM | POA: Diagnosis not present

## 2023-10-25 DIAGNOSIS — F4323 Adjustment disorder with mixed anxiety and depressed mood: Secondary | ICD-10-CM | POA: Diagnosis not present

## 2023-10-26 DIAGNOSIS — F411 Generalized anxiety disorder: Secondary | ICD-10-CM | POA: Diagnosis not present

## 2023-10-26 DIAGNOSIS — F900 Attention-deficit hyperactivity disorder, predominantly inattentive type: Secondary | ICD-10-CM | POA: Diagnosis not present

## 2023-10-26 DIAGNOSIS — F32A Depression, unspecified: Secondary | ICD-10-CM | POA: Diagnosis not present

## 2023-10-30 DIAGNOSIS — Z419 Encounter for procedure for purposes other than remedying health state, unspecified: Secondary | ICD-10-CM | POA: Diagnosis not present

## 2023-11-03 ENCOUNTER — Ambulatory Visit
Admission: RE | Admit: 2023-11-03 | Discharge: 2023-11-03 | Disposition: A | Discharge status: Home / Self Care | Source: Ambulatory Visit | Attending: Internal Medicine | Admitting: Internal Medicine

## 2023-11-03 DIAGNOSIS — Z1231 Encounter for screening mammogram for malignant neoplasm of breast: Secondary | ICD-10-CM | POA: Diagnosis not present

## 2023-11-08 DIAGNOSIS — F4323 Adjustment disorder with mixed anxiety and depressed mood: Secondary | ICD-10-CM | POA: Diagnosis not present

## 2023-11-15 DIAGNOSIS — F4323 Adjustment disorder with mixed anxiety and depressed mood: Secondary | ICD-10-CM | POA: Diagnosis not present

## 2023-11-17 DIAGNOSIS — F32A Depression, unspecified: Secondary | ICD-10-CM | POA: Diagnosis not present

## 2023-11-17 DIAGNOSIS — Z5181 Encounter for therapeutic drug level monitoring: Secondary | ICD-10-CM | POA: Diagnosis not present

## 2023-11-17 DIAGNOSIS — F411 Generalized anxiety disorder: Secondary | ICD-10-CM | POA: Diagnosis not present

## 2023-11-17 DIAGNOSIS — Z79899 Other long term (current) drug therapy: Secondary | ICD-10-CM | POA: Diagnosis not present

## 2023-11-17 DIAGNOSIS — F900 Attention-deficit hyperactivity disorder, predominantly inattentive type: Secondary | ICD-10-CM | POA: Diagnosis not present

## 2023-11-22 DIAGNOSIS — F411 Generalized anxiety disorder: Secondary | ICD-10-CM | POA: Diagnosis not present

## 2023-11-22 DIAGNOSIS — F32A Depression, unspecified: Secondary | ICD-10-CM | POA: Diagnosis not present

## 2023-11-29 DIAGNOSIS — Z419 Encounter for procedure for purposes other than remedying health state, unspecified: Secondary | ICD-10-CM | POA: Diagnosis not present

## 2023-12-09 ENCOUNTER — Other Ambulatory Visit: Payer: Self-pay

## 2023-12-09 DIAGNOSIS — Z30011 Encounter for initial prescription of contraceptive pills: Secondary | ICD-10-CM

## 2023-12-09 DIAGNOSIS — F4323 Adjustment disorder with mixed anxiety and depressed mood: Secondary | ICD-10-CM | POA: Diagnosis not present

## 2023-12-09 MED ORDER — NORETHIN ACE-ETH ESTRAD-FE 1-20 MG-MCG PO TABS: 1.0000 | ORAL_TABLET | Freq: Every day | ORAL | 0 refills | Status: DC

## 2023-12-15 DIAGNOSIS — F900 Attention-deficit hyperactivity disorder, predominantly inattentive type: Secondary | ICD-10-CM | POA: Diagnosis not present

## 2023-12-15 DIAGNOSIS — Z79899 Other long term (current) drug therapy: Secondary | ICD-10-CM | POA: Diagnosis not present

## 2023-12-15 DIAGNOSIS — F32A Depression, unspecified: Secondary | ICD-10-CM | POA: Diagnosis not present

## 2023-12-15 DIAGNOSIS — Z5181 Encounter for therapeutic drug level monitoring: Secondary | ICD-10-CM | POA: Diagnosis not present

## 2023-12-15 DIAGNOSIS — F411 Generalized anxiety disorder: Secondary | ICD-10-CM | POA: Diagnosis not present

## 2023-12-16 ENCOUNTER — Ambulatory Visit (INDEPENDENT_AMBULATORY_CARE_PROVIDER_SITE_OTHER): Admitting: Internal Medicine

## 2023-12-16 ENCOUNTER — Telehealth: Payer: Self-pay | Admitting: Internal Medicine

## 2023-12-16 ENCOUNTER — Other Ambulatory Visit: Payer: Self-pay | Admitting: Internal Medicine

## 2023-12-16 ENCOUNTER — Encounter: Payer: Self-pay | Admitting: Internal Medicine

## 2023-12-16 VITALS — BP 110/76 | HR 136 | Ht 62.0 in | Wt 126.5 lb

## 2023-12-16 DIAGNOSIS — F4323 Adjustment disorder with mixed anxiety and depressed mood: Secondary | ICD-10-CM | POA: Diagnosis not present

## 2023-12-16 DIAGNOSIS — K5900 Constipation, unspecified: Secondary | ICD-10-CM

## 2023-12-16 DIAGNOSIS — G43009 Migraine without aura, not intractable, without status migrainosus: Secondary | ICD-10-CM

## 2023-12-16 MED ORDER — RIZATRIPTAN BENZOATE 10 MG PO TABS: 10.0000 mg | ORAL_TABLET | ORAL | 2 refills | Status: AC | PRN

## 2023-12-16 NOTE — Progress Notes (Unsigned)
 Date:  12/16/2023   Name:  Lori Lawson   DOB:  07-17-1981   MRN:  [id number]   Chief Complaint: No chief complaint on file.  HPI  Review of Systems   Lab Results  Component Value Date   NA 140 07/26/2023   K 4.3 07/26/2023   CO2 18 (L) 07/26/2023   GLUCOSE 71 07/26/2023   BUN 11 07/26/2023   CREATININE 0.92 07/26/2023   CALCIUM 9.6 07/26/2023   EGFR 80 07/26/2023   GFRNONAA >60 06/17/2020   Lab Results  Component Value Date   CHOL 173 07/26/2023   HDL 31 (L) 07/26/2023   LDLCALC 93 07/26/2023   TRIG 292 (H) 07/26/2023   CHOLHDL 5.6 (H) 07/26/2023   Lab Results  Component Value Date   TSH 3.750 07/26/2023   Lab Results  Component Value Date   HGBA1C 5.6 07/26/2023   Lab Results  Component Value Date   WBC 3.8 07/26/2023   HGB 11.4 07/26/2023   HCT 34.8 07/26/2023   MCV 87 07/26/2023   PLT 269 07/26/2023   Lab Results  Component Value Date   ALT 15 07/26/2023   AST 20 07/26/2023   ALKPHOS 85 07/26/2023   BILITOT <0.2 07/26/2023   No results found for: "25OHVITD2", "25OHVITD3", "VD25OH"   Patient Active Problem List   Diagnosis Date Noted   Migraine without aura and without status migrainosus, not intractable 07/26/2023   Constipation in female 08/27/2021   Major depression single episode, in partial remission (HCC) 08/27/2021   Generalized anxiety disorder 08/27/2021   Hepatitis C virus infection without hepatic coma 09/25/2020    Allergies  Allergen Reactions   Suboxone [Buprenorphine Hcl-Naloxone Hcl] Anaphylaxis and Nausea And Vomiting    Past Surgical History:  Procedure Laterality Date   CESAREAN SECTION      Social History   Tobacco Use   Smoking status: Former    Current packs/day: 0.00    Types: Cigarettes    Quit date: 01/17/2021    Years since quitting: 2.9   Smokeless tobacco: Never  Vaping Use   Vaping status: Every Day  Substance Use Topics   Alcohol use: Yes    Comment: occ.   Drug use: Yes    Types: Marijuana      Medication list has been reviewed and updated.  No outpatient medications have been marked as taking for the 12/16/23 encounter (Orders Only) with Sheron Dixons, MD.       12/16/2023   11:41 AM 09/01/2023   10:21 AM 07/26/2023    8:40 AM 02/03/2023    3:18 PM  GAD 7 : Generalized Anxiety Score  Nervous, Anxious, on Edge 2 0 0 3  Control/stop worrying 0 0 0 1  Worry too much - different things 2 0 0 1  Trouble relaxing 1 0 0 1  Restless 1 0 0 3  Easily annoyed or irritable 1 0 0 1  Afraid - awful might happen 0 0 0 0  Total GAD 7 Score 7 0 0 10  Anxiety Difficulty Somewhat difficult Not difficult at all Not difficult at all Very difficult       12/16/2023   11:41 AM 09/01/2023   10:21 AM 07/26/2023    8:40 AM  Depression screen PHQ 2/9  Decreased Interest 1 0 0  Down, Depressed, Hopeless 0 0 0  PHQ - 2 Score 1 0 0  Altered sleeping 3 1 0  Tired, decreased energy 2 1 0  Change in appetite 0 0 0  Feeling bad or failure about yourself  0 0 0  Trouble concentrating 2 2 0  Moving slowly or fidgety/restless 3 1 0  Suicidal thoughts 0 0 0  PHQ-9 Score 11 5 0  Difficult doing work/chores Somewhat difficult Somewhat difficult Not difficult at all    BP Readings from Last 3 Encounters:  12/16/23 110/76  09/01/23 100/74  07/26/23 104/68    Physical Exam  Wt Readings from Last 3 Encounters:  12/16/23 126 lb 8 oz (57.4 kg)  09/01/23 145 lb (65.8 kg)  07/26/23 151 lb (68.5 kg)    There were no vitals taken for this visit.  Assessment and Plan:  Problem List Items Addressed This Visit   None   No follow-ups on file.    Sheron Dixons, MD [facility] [place]

## 2023-12-16 NOTE — Telephone Encounter (Signed)
 Please review

## 2023-12-16 NOTE — Telephone Encounter (Signed)
 Patient stated the doctor name is Vanga for Referral for [facility]. Patient stated the Doctor will now be moving over to [facility] and please send referral there.

## 2023-12-16 NOTE — Assessment & Plan Note (Signed)
 Recurrent/persistent migraine not responding consistently to Tylenol or BC powders. Has never taken Triptans.   No focal neurological symptoms. Will prescribe Maxalt.  Follow up if no improvement or if not tolerated.

## 2023-12-16 NOTE — Progress Notes (Signed)
 Date:  12/16/2023   Name:  Lori Lawson   DOB:  1981-05-07   MRN:  [id number]   Chief Complaint: Migraine (Patient said the migraine makes her eyes hurt, has been going on everyday for the last couple of weeks, consistent pain last all day.)  Migraine  This is a recurrent problem. The current episode started in the past 7 days. The problem occurs daily. The problem has been unchanged. The pain is located in the Retro-orbital region. Associated symptoms include blurred vision, nausea and photophobia. Pertinent negatives include no fever, visual change, vomiting or weakness.    Review of Systems  Constitutional:  Negative for chills, fatigue and fever.  Eyes:  Positive for blurred vision and photophobia. Negative for visual disturbance.  Respiratory:  Negative for shortness of breath.   Cardiovascular:  Negative for chest pain.  Gastrointestinal:  Positive for nausea. Negative for vomiting.  Neurological:  Positive for headaches. Negative for syncope, speech difficulty and weakness.  Psychiatric/Behavioral:  Negative for dysphoric mood and sleep disturbance. The patient is nervous/anxious.      Lab Results  Component Value Date   NA 140 07/26/2023   K 4.3 07/26/2023   CO2 18 (L) 07/26/2023   GLUCOSE 71 07/26/2023   BUN 11 07/26/2023   CREATININE 0.92 07/26/2023   CALCIUM 9.6 07/26/2023   EGFR 80 07/26/2023   GFRNONAA >60 06/17/2020   Lab Results  Component Value Date   CHOL 173 07/26/2023   HDL 31 (L) 07/26/2023   LDLCALC 93 07/26/2023   TRIG 292 (H) 07/26/2023   CHOLHDL 5.6 (H) 07/26/2023   Lab Results  Component Value Date   TSH 3.750 07/26/2023   Lab Results  Component Value Date   HGBA1C 5.6 07/26/2023   Lab Results  Component Value Date   WBC 3.8 07/26/2023   HGB 11.4 07/26/2023   HCT 34.8 07/26/2023   MCV 87 07/26/2023   PLT 269 07/26/2023   Lab Results  Component Value Date   ALT 15 07/26/2023   AST 20 07/26/2023   ALKPHOS 85 07/26/2023   BILITOT  <0.2 07/26/2023   No results found for: "25OHVITD2", "25OHVITD3", "VD25OH"   Patient Active Problem List   Diagnosis Date Noted   Migraine without aura and without status migrainosus, not intractable 07/26/2023   Constipation in female 08/27/2021   Major depression single episode, in partial remission (HCC) 08/27/2021   Generalized anxiety disorder 08/27/2021   Hepatitis C virus infection without hepatic coma 09/25/2020    Allergies  Allergen Reactions   Suboxone [Buprenorphine Hcl-Naloxone Hcl] Anaphylaxis and Nausea And Vomiting    Past Surgical History:  Procedure Laterality Date   CESAREAN SECTION      Social History   Tobacco Use   Smoking status: Former    Current packs/day: 0.00    Types: Cigarettes    Quit date: 01/17/2021    Years since quitting: 2.9   Smokeless tobacco: Never  Vaping Use   Vaping status: Every Day  Substance Use Topics   Alcohol use: Yes    Comment: occ.   Drug use: Yes    Types: Marijuana     Medication list has been reviewed and updated.  Current Meds  Medication Sig   busPIRone (BUSPAR) 15 MG tablet Take 15 mg by mouth in the Lori and at bedtime.   FANAPT 2 MG TABS Take 2 mg by mouth daily at 6 (six) AM.   hydrOXYzine (VISTARIL) 25 MG capsule Take 25 mg by  mouth 2 (two) times daily.   ibuprofen (ADVIL) 600 MG tablet Take 600 mg by mouth as needed.   Multiple Vitamin (MULTI-VITAMIN PO) Take by mouth daily.   naproxen  (NAPROSYN ) 500 MG tablet Take 1 tablet (500 mg total) by mouth 2 (two) times daily with a meal.   norethindrone-ethinyl estradiol-FE (JUNEL FE 1/20) 1-20 MG-MCG tablet Take 1 tablet by mouth daily.   OLANZapine (ZYPREXA) 7.5 MG tablet Take 7.5 mg by mouth at bedtime.   ondansetron  (ZOFRAN ) 4 MG tablet Take 1 tablet (4 mg total) by mouth every 8 (eight) hours as needed for nausea or vomiting.   pantoprazole  (PROTONIX ) 40 MG tablet TAKE 1 TABLET BY MOUTH EVERY DAY   QELBREE 200 MG 24 hr capsule Take 600 mg by mouth every  Lori.   rizatriptan (MAXALT) 10 MG tablet Take 1 tablet (10 mg total) by mouth as needed for migraine. May repeat in 2 hours if needed   scopolamine (TRANSDERM-SCOP) 1 MG/3DAYS Place 1 patch onto the skin every 3 (three) days.   sertraline (ZOLOFT) 100 MG tablet Take 200 mg by mouth daily. Dr.   Albertine Alpha   SUBLOCADE 300 MG/1.5ML SOSY Inject into the skin.   topiramate (TOPAMAX) 200 MG tablet Take 200 mg by mouth at bedtime.   VYVANSE  60 MG capsule Take 60 mg by mouth every Lori.       12/16/2023   11:41 AM 09/01/2023   10:21 AM 07/26/2023    8:40 AM 02/03/2023    3:18 PM  GAD 7 : Generalized Anxiety Score  Nervous, Anxious, on Edge 2 0 0 3  Control/stop worrying 0 0 0 1  Worry too much - different things 2 0 0 1  Trouble relaxing 1 0 0 1  Restless 1 0 0 3  Easily annoyed or irritable 1 0 0 1  Afraid - awful might happen 0 0 0 0  Total GAD 7 Score 7 0 0 10  Anxiety Difficulty Somewhat difficult Not difficult at all Not difficult at all Very difficult       12/16/2023   11:41 AM 09/01/2023   10:21 AM 07/26/2023    8:40 AM  Depression screen PHQ 2/9  Decreased Interest 1 0 0  Down, Depressed, Hopeless 0 0 0  PHQ - 2 Score 1 0 0  Altered sleeping 3 1 0  Tired, decreased energy 2 1 0  Change in appetite 0 0 0  Feeling bad or failure about yourself  0 0 0  Trouble concentrating 2 2 0  Moving slowly or fidgety/restless 3 1 0  Suicidal thoughts 0 0 0  PHQ-9 Score 11 5 0  Difficult doing work/chores Somewhat difficult Somewhat difficult Not difficult at all    BP Readings from Last 3 Encounters:  12/16/23 110/76  09/01/23 100/74  07/26/23 104/68    Physical Exam Vitals and nursing note reviewed.  Constitutional:      General: She is not in acute distress.    Appearance: Normal appearance. She is well-developed.  HENT:     Head: Normocephalic and atraumatic.     Jaw: There is normal jaw occlusion.     Nose:     Right Sinus: No maxillary sinus tenderness or frontal  sinus tenderness.     Left Sinus: No maxillary sinus tenderness or frontal sinus tenderness.  Cardiovascular:     Rate and Rhythm: Normal rate and regular rhythm.  Pulmonary:     Effort: Pulmonary effort is normal. No respiratory distress.  Breath sounds: No wheezing or rhonchi.  Musculoskeletal:     Cervical back: Normal range of motion.     Right lower leg: No edema.     Left lower leg: No edema.  Lymphadenopathy:     Cervical: No cervical adenopathy.  Skin:    General: Skin is warm and dry.     Findings: No rash.  Neurological:     General: No focal deficit present.     Mental Status: She is alert and oriented to person, place, and time.  Psychiatric:        Mood and Affect: Mood normal.        Behavior: Behavior normal.     Wt Readings from Last 3 Encounters:  12/16/23 126 lb 8 oz (57.4 kg)  09/01/23 145 lb (65.8 kg)  07/26/23 151 lb (68.5 kg)    BP 110/76   Pulse (!) 136   Ht 5\' 2"  (1.575 m)   Wt 126 lb 8 oz (57.4 kg)   SpO2 97%   BMI 23.14 kg/m   Assessment and Plan:  Problem List Items Addressed This Visit       Unprioritized   Migraine without aura and without status migrainosus, not intractable - Primary   Recurrent/persistent migraine not responding consistently to Tylenol or BC powders. Has never taken Triptans.   No focal neurological symptoms. Will prescribe Maxalt.  Follow up if no improvement or if not tolerated.      Relevant Medications   rizatriptan (MAXALT) 10 MG tablet    No follow-ups on file.    Sheron Dixons, MD [facility] [place]

## 2023-12-20 DIAGNOSIS — F4323 Adjustment disorder with mixed anxiety and depressed mood: Secondary | ICD-10-CM | POA: Diagnosis not present

## 2023-12-23 DIAGNOSIS — F900 Attention-deficit hyperactivity disorder, predominantly inattentive type: Secondary | ICD-10-CM | POA: Diagnosis not present

## 2023-12-23 DIAGNOSIS — F411 Generalized anxiety disorder: Secondary | ICD-10-CM | POA: Diagnosis not present

## 2023-12-23 DIAGNOSIS — F32A Depression, unspecified: Secondary | ICD-10-CM | POA: Diagnosis not present

## 2023-12-27 DIAGNOSIS — F4323 Adjustment disorder with mixed anxiety and depressed mood: Secondary | ICD-10-CM | POA: Diagnosis not present

## 2023-12-27 NOTE — Patient Instructions (Signed)
 Preventive Care 39-43 Years Old, Female Preventive care refers to lifestyle choices and visits with your health care provider that can promote health and wellness. Preventive care visits are also called wellness exams. What can I expect for my preventive care visit? Counseling Your health care provider may ask you questions about your: Medical history, including: Past medical problems. Family medical history. Pregnancy history. Current health, including: Menstrual cycle. Method of birth control. Emotional well-being. Home life and relationship well-being. Sexual activity and sexual health. Lifestyle, including: Alcohol, nicotine or tobacco, and drug use. Access to firearms. Diet, exercise, and sleep habits. Work and work Astronomer. Sunscreen use. Safety issues such as seatbelt and bike helmet use. Physical exam Your health care provider will check your: Height and weight. These may be used to calculate your BMI (body mass index). BMI is a measurement that tells if you are at a healthy weight. Waist circumference. This measures the distance around your waistline. This measurement also tells if you are at a healthy weight and may help predict your risk of certain diseases, such as type 2 diabetes and high blood pressure. Heart rate and blood pressure. Body temperature. Skin for abnormal spots. What immunizations do I need?  Vaccines are usually given at various ages, according to a schedule. Your health care provider will recommend vaccines for you based on your age, medical history, and lifestyle or other factors, such as travel or where you work. What tests do I need? Screening Your health care provider may recommend screening tests for certain conditions. This may include: Lipid and cholesterol levels. Diabetes screening. This is done by checking your blood sugar (glucose) after you have not eaten for a while (fasting). Pelvic exam and Pap test. Hepatitis B test. Hepatitis C  test. HIV (human immunodeficiency virus) test. STI (sexually transmitted infection) testing, if you are at risk. Lung cancer screening. Colorectal cancer screening. Mammogram. Talk with your health care provider about when you should start having regular mammograms. This may depend on whether you have a family history of breast cancer. BRCA-related cancer screening. This may be done if you have a family history of breast, ovarian, tubal, or peritoneal cancers. Bone density scan. This is done to screen for osteoporosis. Talk with your health care provider about your test results, treatment options, and if necessary, the need for more tests. Follow these instructions at home: Eating and drinking  Eat a diet that includes fresh fruits and vegetables, whole grains, lean protein, and low-fat dairy products. Take vitamin and mineral supplements as recommended by your health care provider. Do not drink alcohol if: Your health care provider tells you not to drink. You are pregnant, may be pregnant, or are planning to become pregnant. If you drink alcohol: Limit how much you have to 0-1 drink a day. Know how much alcohol is in your drink. In the [place] one drink equals one 12 oz bottle of beer (355 mL), one 5 oz glass of wine (148 mL), or one 1 oz glass of hard liquor (44 mL). Lifestyle Brush your teeth every morning and night with fluoride toothpaste. Floss one time each day. Exercise for at least 30 minutes 5 or more days each week. Do not use any products that contain nicotine or tobacco. These products include cigarettes, chewing tobacco, and vaping devices, such as e-cigarettes. If you need help quitting, ask your health care provider. Do not use drugs. If you are sexually active, practice safe sex. Use a condom or other form of protection to  prevent STIs. If you do not wish to become pregnant, use a form of birth control. If you plan to become pregnant, see your health care provider for a  prepregnancy visit. Take aspirin only as told by your health care provider. Make sure that you understand how much to take and what form to take. Work with your health care provider to find out whether it is safe and beneficial for you to take aspirin daily. Find healthy ways to manage stress, such as: Meditation, yoga, or listening to music. Journaling. Talking to a trusted person. Spending time with friends and family. Minimize exposure to UV radiation to reduce your risk of skin cancer. Safety Always wear your seat belt while driving or riding in a vehicle. Do not drive: If you have been drinking alcohol. Do not ride with someone who has been drinking. When you are tired or distracted. While texting. If you have been using any mind-altering substances or drugs. Wear a helmet and other protective equipment during sports activities. If you have firearms in your house, make sure you follow all gun safety procedures. Seek help if you have been physically or sexually abused. What's next? Visit your health care provider once a year for an annual wellness visit. Ask your health care provider how often you should have your eyes and teeth checked. Stay up to date on all vaccines. This information is not intended to replace advice given to you by your health care provider. Make sure you discuss any questions you have with your health care provider. Document Revised: 01/01/2021 Document Reviewed: 01/01/2021 Elsevier Patient Education  2024 [employer] Inc. Breast Self-Awareness Breast self-awareness is knowing how your breasts look and feel. You need to: Check your breasts on a regular basis. Tell your doctor about any changes. Become familiar with the look and feel of your breasts. This can help you catch a breast problem while it is still small and can be treated. You should do breast self-exams even if you have breast implants. What you need: A mirror. A well-lit room. A pillow or other  soft object. How to do a breast self-exam Follow these steps to do a breast self-exam: Look for changes  Take off all the clothes above your waist. Stand in front of a mirror in a room with good lighting. Put your hands down at your sides. Compare your breasts in the mirror. Look for any difference between them, such as: A difference in shape. A difference in size. Wrinkles, dips, and bumps in one breast and not the other. Look at each breast for changes in the skin, such as: Redness. Scaly areas. Skin that has gotten thicker. Dimpling. Open sores (ulcers). Look for changes in your nipples, such as: Fluid coming out of a nipple. Fluid around a nipple. Bleeding. Dimpling. Redness. A nipple that looks pushed in (retracted), or that has changed position. Feel for changes Lie on your back. Feel each breast. To do this: Pick a breast to feel. Place a pillow under the shoulder closest to that breast. Put the arm closest to that breast behind your head. Feel the nipple area of that breast using the hand of your other arm. Feel the area with the pads of your three middle fingers by making small circles with your fingers. Use light, medium, and firm pressure. Continue the overlapping circles, moving downward over the breast. Keep making circles with your fingers. Stop when you feel your ribs. Start making circles with your fingers again, this time going  upward until you reach your collarbone. Then, make circles outward across your breast and into your armpit area. Squeeze your nipple. Check for discharge and lumps. Repeat these steps to check your other breast. Sit or stand in the tub or shower. With soapy water on your skin, feel each breast the same way you did when you were lying down. Write down what you find Writing down what you find can help you remember what to tell your doctor. Write down: What is normal for each breast. Any changes you find in each breast. These  include: The kind of changes you find. A tender or painful breast. Any lump you find. Write down its size and where it is. When you last had your monthly period (menstrual cycle). General tips If you are breastfeeding, the best time to check your breasts is after you feed your baby or after you use a breast pump. If you get monthly bleeding, the best time to check your breasts is 5-7 days after your monthly cycle ends. With time, you will become comfortable with the self-exam. You will also start to know if there are changes in your breasts. Contact a doctor if: You see a change in the shape or size of your breasts or nipples. You see a change in the skin of your breast or nipples, such as red or scaly skin. You have fluid coming from your nipples that is not normal. You find a new lump or thick area. You have breast pain. You have any concerns about your breast health. Summary Breast self-awareness includes looking for changes in your breasts and feeling for changes within your breasts. You should do breast self-awareness in front of a mirror in a well-lit room. If you get monthly periods (menstrual cycles), the best time to check your breasts is 5-7 days after your period ends. Tell your doctor about any changes you see in your breasts. Changes include changes in size, changes on the skin, painful or tender breasts, or fluid from your nipples that is not normal. This information is not intended to replace advice given to you by your health care provider. Make sure you discuss any questions you have with your health care provider. Document Revised: 12/11/2021 Document Reviewed: 05/08/2021 Elsevier Patient Education  2024 [employer].

## 2023-12-27 NOTE — Progress Notes (Unsigned)
 GYNECOLOGY ANNUAL PHYSICAL EXAM PROGRESS NOTE  Subjective:    Lori Lawson is a 43 y.o. G37P1021 female who presents for an annual exam.  The patient is sexually active. The patient participates in regular exercise: {yes/no/not asked:9010}. Has the patient ever been transfused or tattooed?: {yes/no/not asked:9010}. The patient reports that there {is/is not:9024} domestic violence in her life.   The patient has the following complaints today:   Menstrual History: Menarche age: 53 No LMP recorded.     Gynecologic History:  Contraception: OCP (estrogen/progesterone) History of STI's:  Last Pap: 12/07/2022. Results were: normal. Denies h/o abnormal pap smears. Last mammogram: 11/03/2023. Results were: normal       OB History  Gravida Para Term Preterm AB Living  3 1 1  0 2 1  SAB IAB Ectopic Multiple Live Births  2 0 0 0 0    # Outcome Date GA Lbr Len/2nd Weight Sex Type Anes PTL Lv  3 Term 05/10/04 [redacted]w[redacted]d   M CS-Unspec     2 SAB           1 SAB             Past Medical History:  Diagnosis Date   Anxiety    Depression    Family history of pancreatic cancer    in her mom, BRCA neg per pt report   Hepatitis C    Opioid dependence (HCC)    prescribed Suboxone by her primary doctor   PTSD (post-traumatic stress disorder)     Past Surgical History:  Procedure Laterality Date   CESAREAN SECTION      Family History  Problem Relation Age of Onset   Pancreatic cancer Mother 80       passed in 2023   Diabetes Mother    Alcohol abuse Mother    Hyperlipidemia Mother    Hypertension Mother    Tuberculosis Paternal Grandmother    Lung cancer Paternal Grandfather     Social History   Socioeconomic History   Marital status: Married    Spouse name: Not on file   Number of children: 1   Years of education: Not on file   Highest education level: 12th grade  Occupational History   Not on file  Tobacco Use   Smoking status: Former    Current packs/day: 0.00     Types: Cigarettes    Quit date: 01/17/2021    Years since quitting: 2.9   Smokeless tobacco: Never  Vaping Use   Vaping status: Every Day  Substance and Sexual Activity   Alcohol use: Yes    Comment: occ.   Drug use: Yes    Types: Marijuana   Sexual activity: Yes    Birth control/protection: Pill  Other Topics Concern   Not on file  Social History Narrative   Not on file   Social Drivers of Health   Financial Resource Strain: Medium Risk (12/15/2023)   Overall Financial Resource Strain (CARDIA)    Difficulty of Paying Living Expenses: Somewhat hard  Food Insecurity: Food Insecurity Present (12/15/2023)   Hunger Vital Sign    Worried About Running Out of Food in the Last Year: Sometimes true    Ran Out of Food in the Last Year: Never true  Transportation Needs: No Transportation Needs (12/15/2023)   PRAPARE - Administrator, Civil Service (Medical): No    Lack of Transportation (Non-Medical): No  Physical Activity: Unknown (12/15/2023)   Exercise Vital Sign  Days of Exercise per Week: 0 days    Minutes of Exercise per Session: Not on file  Stress: Stress Concern Present (12/15/2023)   [employer] of Occupational Health - Occupational Stress Questionnaire    Feeling of Stress : To some extent  Social Connections: Moderately Integrated (12/15/2023)   Social Connection and Isolation Panel [NHANES]    Frequency of Communication with Friends and Family: Once a week    Frequency of Social Gatherings with Friends and Family: Once a week    Attends Religious Services: 1 to 4 times per year    Active Member of [employer] or Organizations: Yes    Attends Engineer, structural: More than 4 times per year    Marital Status: Married  Catering manager Violence: Not At Risk (09/01/2023)   Humiliation, Afraid, Rape, and Kick questionnaire    Fear of Current or Ex-Partner: No    Emotionally Abused: No    Physically Abused: No    Sexually Abused: No    Current  Outpatient Medications on File Prior to Visit  Medication Sig Dispense Refill   busPIRone (BUSPAR) 15 MG tablet Take 15 mg by mouth in the morning and at bedtime.     FANAPT 2 MG TABS Take 2 mg by mouth daily at 6 (six) AM.     hydrOXYzine (VISTARIL) 25 MG capsule Take 25 mg by mouth 2 (two) times daily.     ibuprofen (ADVIL) 600 MG tablet Take 600 mg by mouth as needed.     linaclotide  (LINZESS ) 290 MCG CAPS capsule TAKE 1 CAPSULE BY MOUTH EVERY DAY BEFORE BREAKFAST (Patient not taking: Reported on 12/16/2023) 90 capsule 1   Multiple Vitamin (MULTI-VITAMIN PO) Take by mouth daily.     naproxen  (NAPROSYN ) 500 MG tablet Take 1 tablet (500 mg total) by mouth 2 (two) times daily with a meal. 30 tablet 0   norethindrone-ethinyl estradiol-FE (JUNEL FE 1/20) 1-20 MG-MCG tablet Take 1 tablet by mouth daily. 30 tablet 0   OLANZapine (ZYPREXA) 7.5 MG tablet Take 7.5 mg by mouth at bedtime.     ondansetron  (ZOFRAN ) 4 MG tablet Take 1 tablet (4 mg total) by mouth every 8 (eight) hours as needed for nausea or vomiting. 20 tablet 1   pantoprazole  (PROTONIX ) 40 MG tablet TAKE 1 TABLET BY MOUTH EVERY DAY 90 tablet 3   QELBREE 200 MG 24 hr capsule Take 600 mg by mouth every morning.     rizatriptan  (MAXALT ) 10 MG tablet Take 1 tablet (10 mg total) by mouth as needed for migraine. May repeat in 2 hours if needed 10 tablet 2   scopolamine (TRANSDERM-SCOP) 1 MG/3DAYS Place 1 patch onto the skin every 3 (three) days.     sertraline (ZOLOFT) 100 MG tablet Take 200 mg by mouth daily. Dr.   Albertine Alpha     SUBLOCADE 300 MG/1.5ML SOSY Inject into the skin.     topiramate (TOPAMAX) 200 MG tablet Take 200 mg by mouth at bedtime.     VYVANSE  60 MG capsule Take 60 mg by mouth every morning.     No current facility-administered medications on file prior to visit.    Allergies  Allergen Reactions   Suboxone [Buprenorphine Hcl-Naloxone Hcl] Anaphylaxis and Nausea And Vomiting     Review of Systems Constitutional:  negative for chills, fatigue, fevers and sweats Eyes: negative for irritation, redness and visual disturbance Ears, nose, mouth, throat, and face: negative for hearing loss, nasal congestion, snoring and tinnitus Respiratory: negative for  asthma, cough, sputum Cardiovascular: negative for chest pain, dyspnea, exertional chest pressure/discomfort, irregular heart beat, palpitations and syncope Gastrointestinal: negative for abdominal pain, change in bowel habits, nausea and vomiting Genitourinary: negative for abnormal menstrual periods, genital lesions, sexual problems and vaginal discharge, dysuria and urinary incontinence Integument/breast: negative for breast lump, breast tenderness and nipple discharge Hematologic/lymphatic: negative for bleeding and easy bruising Musculoskeletal:negative for back pain and muscle weakness Neurological: negative for dizziness, headaches, vertigo and weakness Endocrine: negative for diabetic symptoms including polydipsia, polyuria and skin dryness Allergic/Immunologic: negative for hay fever and urticaria      Objective:  There were no vitals taken for this visit. There is no height or weight on file to calculate BMI.    General Appearance:    Alert, cooperative, no distress, appears stated age  Head:    Normocephalic, without obvious abnormality, atraumatic  Eyes:    PERRL, conjunctiva/corneas clear, EOM's intact, both eyes  Ears:    Normal external ear canals, both ears  Nose:   Nares normal, septum midline, mucosa normal, no drainage or sinus tenderness  Throat:   Lips, mucosa, and tongue normal; teeth and gums normal  Neck:   Supple, symmetrical, trachea midline, no adenopathy; thyroid : no enlargement/tenderness/nodules; no carotid bruit or JVD  Back:     Symmetric, no curvature, ROM normal, no CVA tenderness  Lungs:     Clear to auscultation bilaterally, respirations unlabored  Chest Wall:    No tenderness or deformity   Heart:    Regular rate and  rhythm, S1 and S2 normal, no murmur, rub or gallop  Breast Exam:    No tenderness, masses, or nipple abnormality  Abdomen:     Soft, non-tender, bowel sounds active all four quadrants, no masses, no organomegaly.    Genitalia:    Pelvic:external genitalia normal, vagina without lesions, discharge, or tenderness, rectovaginal septum  normal. Cervix normal in appearance, no cervical motion tenderness, no adnexal masses or tenderness.  Uterus normal size, shape, mobile, regular contours, nontender.  Rectal:    Normal external sphincter.  No hemorrhoids appreciated. Internal exam not done.   Extremities:   Extremities normal, atraumatic, no cyanosis or edema  Pulses:   2+ and symmetric all extremities  Skin:   Skin color, texture, turgor normal, no rashes or lesions  Lymph nodes:   Cervical, supraclavicular, and axillary nodes normal  Neurologic:   CNII-XII intact, normal strength, sensation and reflexes throughout   .  Labs:  Lab Results  Component Value Date   WBC 3.8 07/26/2023   HGB 11.4 07/26/2023   HCT 34.8 07/26/2023   MCV 87 07/26/2023   PLT 269 07/26/2023    Lab Results  Component Value Date   CREATININE 0.92 07/26/2023   BUN 11 07/26/2023   NA 140 07/26/2023   K 4.3 07/26/2023   CL 108 (H) 07/26/2023   CO2 18 (L) 07/26/2023    Lab Results  Component Value Date   ALT 15 07/26/2023   AST 20 07/26/2023   ALKPHOS 85 07/26/2023   BILITOT <0.2 07/26/2023    Lab Results  Component Value Date   TSH 3.750 07/26/2023     Assessment:   No diagnosis found.   Plan:  Blood tests: UTD by PCP. Breast self exam technique reviewed and patient encouraged to perform self-exam monthly. Contraception: {contraceptive methods:5051}. Discussed healthy lifestyle modifications. Mammogram UTD Pap smear UTD. Flu vaccine:N/A Follow up in 1 year for annual exam   Donato Fu, CNM [facility]

## 2023-12-29 ENCOUNTER — Ambulatory Visit (INDEPENDENT_AMBULATORY_CARE_PROVIDER_SITE_OTHER): Admitting: Certified Nurse Midwife

## 2023-12-29 ENCOUNTER — Encounter: Payer: Self-pay | Admitting: Certified Nurse Midwife

## 2023-12-29 VITALS — BP 142/108 | HR 97 | Resp 16 | Ht 62.0 in | Wt 127.9 lb

## 2023-12-29 DIAGNOSIS — Z01419 Encounter for gynecological examination (general) (routine) without abnormal findings: Secondary | ICD-10-CM | POA: Diagnosis not present

## 2023-12-29 DIAGNOSIS — Z30011 Encounter for initial prescription of contraceptive pills: Secondary | ICD-10-CM

## 2023-12-29 MED ORDER — NORETHIN ACE-ETH ESTRAD-FE 1-20 MG-MCG PO TABS: 1.0000 | ORAL_TABLET | Freq: Every day | ORAL | 3 refills | Status: AC

## 2023-12-30 DIAGNOSIS — Z419 Encounter for procedure for purposes other than remedying health state, unspecified: Secondary | ICD-10-CM | POA: Diagnosis not present

## 2024-01-10 ENCOUNTER — Ambulatory Visit (INDEPENDENT_AMBULATORY_CARE_PROVIDER_SITE_OTHER): Admitting: Internal Medicine

## 2024-01-10 VITALS — BP 124/82 | HR 93 | Ht 62.0 in | Wt 128.1 lb

## 2024-01-10 DIAGNOSIS — M6740 Ganglion, unspecified site: Secondary | ICD-10-CM | POA: Diagnosis not present

## 2024-01-10 NOTE — Progress Notes (Signed)
 Date:  01/10/2024   Name:  Lori Lawson   DOB:  Apr 24, 1981   MRN:  [id number]   Chief Complaint: Ganglion Cyst (Left wrist, hand gets numb and swollen, noticed its been getting bigger in size, uses a wrist brace everyday)  Wrist Pain  The pain is present in the left wrist. This is a new problem. The current episode started 1 to 4 weeks ago. The problem occurs daily. The problem has been gradually worsening. The quality of the pain is described as aching. The pain is mild. Pertinent negatives include no fever or numbness.    Review of Systems  Constitutional:  Negative for chills, fatigue and fever.  Respiratory:  Negative for chest tightness and shortness of breath.   Cardiovascular:  Negative for chest pain and palpitations.  Musculoskeletal:  Positive for arthralgias.  Neurological:  Negative for weakness and numbness.     Lab Results  Component Value Date   NA 140 07/26/2023   K 4.3 07/26/2023   CO2 18 (L) 07/26/2023   GLUCOSE 71 07/26/2023   BUN 11 07/26/2023   CREATININE 0.92 07/26/2023   CALCIUM 9.6 07/26/2023   EGFR 80 07/26/2023   GFRNONAA >60 06/17/2020   Lab Results  Component Value Date   CHOL 173 07/26/2023   HDL 31 (L) 07/26/2023   LDLCALC 93 07/26/2023   TRIG 292 (H) 07/26/2023   CHOLHDL 5.6 (H) 07/26/2023   Lab Results  Component Value Date   TSH 3.750 07/26/2023   Lab Results  Component Value Date   HGBA1C 5.6 07/26/2023   Lab Results  Component Value Date   WBC 3.8 07/26/2023   HGB 11.4 07/26/2023   HCT 34.8 07/26/2023   MCV 87 07/26/2023   PLT 269 07/26/2023   Lab Results  Component Value Date   ALT 15 07/26/2023   AST 20 07/26/2023   ALKPHOS 85 07/26/2023   BILITOT <0.2 07/26/2023   No results found for: MARIEN BOLLS, VD25OH   Patient Active Problem List   Diagnosis Date Noted   Migraine without aura and without status migrainosus, not intractable 07/26/2023   Constipation in female 08/27/2021   Major depression  single episode, in partial remission (HCC) 08/27/2021   Generalized anxiety disorder 08/27/2021   Hepatitis C virus infection without hepatic coma 09/25/2020    Allergies  Allergen Reactions   Suboxone [Buprenorphine Hcl-Naloxone Hcl] Anaphylaxis and Nausea And Vomiting    Past Surgical History:  Procedure Laterality Date   CESAREAN SECTION      Social History   Tobacco Use   Smoking status: Former    Current packs/day: 0.00    Types: Cigarettes    Quit date: 01/17/2021    Years since quitting: 2.9   Smokeless tobacco: Never  Vaping Use   Vaping status: Every Day  Substance Use Topics   Alcohol use: Yes    Comment: occ.   Drug use: Not Currently     Medication list has been reviewed and updated.  Current Meds  Medication Sig   busPIRone (BUSPAR) 15 MG tablet Take 15 mg by mouth in the morning and at bedtime.   FANAPT 2 MG TABS Take 2 mg by mouth daily at 6 (six) AM.   hydrOXYzine (VISTARIL) 25 MG capsule Take 25 mg by mouth 2 (two) times daily.   ibuprofen (ADVIL) 600 MG tablet Take 600 mg by mouth as needed.   Multiple Vitamin (MULTI-VITAMIN PO) Take by mouth daily.   naproxen  (NAPROSYN ) 500 MG  tablet Take 1 tablet (500 mg total) by mouth 2 (two) times daily with a meal.   norethindrone-ethinyl estradiol-FE (JUNEL FE 1/20) 1-20 MG-MCG tablet Take 1 tablet by mouth daily.   OLANZapine (ZYPREXA) 7.5 MG tablet Take 7.5 mg by mouth at bedtime.   ondansetron  (ZOFRAN ) 4 MG tablet Take 1 tablet (4 mg total) by mouth every 8 (eight) hours as needed for nausea or vomiting.   pantoprazole  (PROTONIX ) 40 MG tablet TAKE 1 TABLET BY MOUTH EVERY DAY   QELBREE 200 MG 24 hr capsule Take 600 mg by mouth every morning.   rizatriptan  (MAXALT ) 10 MG tablet Take 1 tablet (10 mg total) by mouth as needed for migraine. May repeat in 2 hours if needed   scopolamine (TRANSDERM-SCOP) 1 MG/3DAYS Place 1 patch onto the skin every 3 (three) days.   sertraline (ZOLOFT) 100 MG tablet Take 200 mg by  mouth daily. Dr.   Wynelle   SUBLOCADE 300 MG/1.5ML SOSY Inject into the skin.   topiramate (TOPAMAX) 200 MG tablet Take 200 mg by mouth at bedtime.   VYVANSE  60 MG capsule Take 60 mg by mouth every morning.       12/16/2023   11:41 AM 09/01/2023   10:21 AM 07/26/2023    8:40 AM 02/03/2023    3:18 PM  GAD 7 : Generalized Anxiety Score  Nervous, Anxious, on Edge 2 0 0 3  Control/stop worrying 0 0 0 1  Worry too much - different things 2 0 0 1  Trouble relaxing 1 0 0 1  Restless 1 0 0 3  Easily annoyed or irritable 1 0 0 1  Afraid - awful might happen 0 0 0 0  Total GAD 7 Score 7 0 0 10  Anxiety Difficulty Somewhat difficult Not difficult at all Not difficult at all Very difficult       12/16/2023   11:41 AM 09/01/2023   10:21 AM 07/26/2023    8:40 AM  Depression screen PHQ 2/9  Decreased Interest 1 0 0  Down, Depressed, Hopeless 0 0 0  PHQ - 2 Score 1 0 0  Altered sleeping 3 1 0  Tired, decreased energy 2 1 0  Change in appetite 0 0 0  Feeling bad or failure about yourself  0 0 0  Trouble concentrating 2 2 0  Moving slowly or fidgety/restless 3 1 0  Suicidal thoughts 0 0 0  PHQ-9 Score 11 5 0  Difficult doing work/chores Somewhat difficult Somewhat difficult Not difficult at all    BP Readings from Last 3 Encounters:  01/10/24 124/82  12/29/23 (!) 142/108  12/16/23 110/76    Physical Exam Constitutional:      Appearance: Normal appearance.   Cardiovascular:     Rate and Rhythm: Normal rate and regular rhythm.  Pulmonary:     Effort: Pulmonary effort is normal.   Musculoskeletal:     Left wrist: Swelling present.       Arms:     Comments: Medial aspect volar wrist - 1 cm cystic soft non mobile subcutaneous lesion.   Neurological:     Mental Status: She is alert.     Wt Readings from Last 3 Encounters:  01/10/24 128 lb 2 oz (58.1 kg)  12/29/23 127 lb 14.4 oz (58 kg)  12/16/23 126 lb 8 oz (57.4 kg)    BP 124/82   Pulse 93   Ht 5' 2 (1.575 m)   Wt  128 lb 2 oz (58.1 kg)  LMP  (LMP Unknown)   SpO2 99%   BMI 23.43 kg/m   Assessment and Plan:  Problem List Items Addressed This Visit   None Visit Diagnoses       Ganglion cyst    -  Primary   recommend avoiding compression and manipulation will refer to Ortho for consideration of excision   Relevant Orders   Ambulatory referral to Orthopedic Surgery       No follow-ups on file.    Leita HILARIO Adie, MD [facility] [place]

## 2024-01-12 DIAGNOSIS — Z5181 Encounter for therapeutic drug level monitoring: Secondary | ICD-10-CM | POA: Diagnosis not present

## 2024-01-12 DIAGNOSIS — F411 Generalized anxiety disorder: Secondary | ICD-10-CM | POA: Diagnosis not present

## 2024-01-12 DIAGNOSIS — F900 Attention-deficit hyperactivity disorder, predominantly inattentive type: Secondary | ICD-10-CM | POA: Diagnosis not present

## 2024-01-12 DIAGNOSIS — F32A Depression, unspecified: Secondary | ICD-10-CM | POA: Diagnosis not present

## 2024-01-12 DIAGNOSIS — Z79899 Other long term (current) drug therapy: Secondary | ICD-10-CM | POA: Diagnosis not present

## 2024-01-17 DIAGNOSIS — F4323 Adjustment disorder with mixed anxiety and depressed mood: Secondary | ICD-10-CM | POA: Diagnosis not present

## 2024-01-19 DIAGNOSIS — M71332 Other bursal cyst, left wrist: Secondary | ICD-10-CM | POA: Diagnosis not present

## 2024-01-20 DIAGNOSIS — F411 Generalized anxiety disorder: Secondary | ICD-10-CM | POA: Diagnosis not present

## 2024-01-20 DIAGNOSIS — F900 Attention-deficit hyperactivity disorder, predominantly inattentive type: Secondary | ICD-10-CM | POA: Diagnosis not present

## 2024-01-24 DIAGNOSIS — K5903 Drug induced constipation: Secondary | ICD-10-CM | POA: Diagnosis not present

## 2024-01-24 DIAGNOSIS — T402X5A Adverse effect of other opioids, initial encounter: Secondary | ICD-10-CM | POA: Diagnosis not present

## 2024-01-27 DIAGNOSIS — M67432 Ganglion, left wrist: Secondary | ICD-10-CM | POA: Diagnosis not present

## 2024-01-29 DIAGNOSIS — Z419 Encounter for procedure for purposes other than remedying health state, unspecified: Secondary | ICD-10-CM | POA: Diagnosis not present

## 2024-01-31 DIAGNOSIS — F4323 Adjustment disorder with mixed anxiety and depressed mood: Secondary | ICD-10-CM | POA: Diagnosis not present

## 2024-02-10 DIAGNOSIS — Z5181 Encounter for therapeutic drug level monitoring: Secondary | ICD-10-CM | POA: Diagnosis not present

## 2024-02-10 DIAGNOSIS — Z79899 Other long term (current) drug therapy: Secondary | ICD-10-CM | POA: Diagnosis not present

## 2024-02-10 DIAGNOSIS — F900 Attention-deficit hyperactivity disorder, predominantly inattentive type: Secondary | ICD-10-CM | POA: Diagnosis not present

## 2024-02-10 DIAGNOSIS — F32A Depression, unspecified: Secondary | ICD-10-CM | POA: Diagnosis not present

## 2024-02-10 DIAGNOSIS — F411 Generalized anxiety disorder: Secondary | ICD-10-CM | POA: Diagnosis not present

## 2024-02-14 DIAGNOSIS — F4323 Adjustment disorder with mixed anxiety and depressed mood: Secondary | ICD-10-CM | POA: Diagnosis not present

## 2024-02-29 DIAGNOSIS — Z419 Encounter for procedure for purposes other than remedying health state, unspecified: Secondary | ICD-10-CM | POA: Diagnosis not present

## 2024-02-29 DIAGNOSIS — F900 Attention-deficit hyperactivity disorder, predominantly inattentive type: Secondary | ICD-10-CM | POA: Diagnosis not present

## 2024-02-29 DIAGNOSIS — F411 Generalized anxiety disorder: Secondary | ICD-10-CM | POA: Diagnosis not present

## 2024-02-29 DIAGNOSIS — F32A Depression, unspecified: Secondary | ICD-10-CM | POA: Diagnosis not present

## 2024-03-08 DIAGNOSIS — F900 Attention-deficit hyperactivity disorder, predominantly inattentive type: Secondary | ICD-10-CM | POA: Diagnosis not present

## 2024-03-08 DIAGNOSIS — Z5181 Encounter for therapeutic drug level monitoring: Secondary | ICD-10-CM | POA: Diagnosis not present

## 2024-03-08 DIAGNOSIS — F32A Depression, unspecified: Secondary | ICD-10-CM | POA: Diagnosis not present

## 2024-03-08 DIAGNOSIS — F411 Generalized anxiety disorder: Secondary | ICD-10-CM | POA: Diagnosis not present

## 2024-03-08 DIAGNOSIS — Z79899 Other long term (current) drug therapy: Secondary | ICD-10-CM | POA: Diagnosis not present

## 2024-03-09 ENCOUNTER — Telehealth: Admitting: Physician Assistant

## 2024-03-09 DIAGNOSIS — R3989 Other symptoms and signs involving the genitourinary system: Secondary | ICD-10-CM | POA: Diagnosis not present

## 2024-03-09 MED ORDER — CEPHALEXIN 500 MG PO CAPS: 500.0000 mg | ORAL_CAPSULE | Freq: Two times a day (BID) | ORAL | 0 refills | Status: AC

## 2024-03-09 NOTE — Progress Notes (Signed)
 I have spent 5 minutes in review of e-visit questionnaire, review and updating patient chart, medical decision making and response to patient.   Elsie Velma Lunger, PA-C

## 2024-03-09 NOTE — Progress Notes (Signed)

## 2024-03-27 DIAGNOSIS — F3175 Bipolar disorder, in partial remission, most recent episode depressed: Secondary | ICD-10-CM | POA: Diagnosis not present

## 2024-03-27 DIAGNOSIS — F411 Generalized anxiety disorder: Secondary | ICD-10-CM | POA: Diagnosis not present

## 2024-03-27 DIAGNOSIS — F900 Attention-deficit hyperactivity disorder, predominantly inattentive type: Secondary | ICD-10-CM | POA: Diagnosis not present

## 2024-03-31 DIAGNOSIS — Z419 Encounter for procedure for purposes other than remedying health state, unspecified: Secondary | ICD-10-CM | POA: Diagnosis not present

## 2024-04-05 DIAGNOSIS — F32A Depression, unspecified: Secondary | ICD-10-CM | POA: Diagnosis not present

## 2024-04-05 DIAGNOSIS — Z5181 Encounter for therapeutic drug level monitoring: Secondary | ICD-10-CM | POA: Diagnosis not present

## 2024-04-05 DIAGNOSIS — F900 Attention-deficit hyperactivity disorder, predominantly inattentive type: Secondary | ICD-10-CM | POA: Diagnosis not present

## 2024-04-05 DIAGNOSIS — F411 Generalized anxiety disorder: Secondary | ICD-10-CM | POA: Diagnosis not present

## 2024-04-05 DIAGNOSIS — Z79899 Other long term (current) drug therapy: Secondary | ICD-10-CM | POA: Diagnosis not present

## 2024-04-17 DIAGNOSIS — F4323 Adjustment disorder with mixed anxiety and depressed mood: Secondary | ICD-10-CM | POA: Diagnosis not present

## 2024-04-21 ENCOUNTER — Other Ambulatory Visit: Payer: Self-pay | Admitting: Gastroenterology

## 2024-04-30 DIAGNOSIS — Z419 Encounter for procedure for purposes other than remedying health state, unspecified: Secondary | ICD-10-CM | POA: Diagnosis not present

## 2024-05-03 DIAGNOSIS — Z5181 Encounter for therapeutic drug level monitoring: Secondary | ICD-10-CM | POA: Diagnosis not present

## 2024-05-03 DIAGNOSIS — F900 Attention-deficit hyperactivity disorder, predominantly inattentive type: Secondary | ICD-10-CM | POA: Diagnosis not present

## 2024-05-03 DIAGNOSIS — Z79899 Other long term (current) drug therapy: Secondary | ICD-10-CM | POA: Diagnosis not present

## 2024-05-03 DIAGNOSIS — F411 Generalized anxiety disorder: Secondary | ICD-10-CM | POA: Diagnosis not present

## 2024-05-03 DIAGNOSIS — F32A Depression, unspecified: Secondary | ICD-10-CM | POA: Diagnosis not present

## 2024-05-30 DIAGNOSIS — F411 Generalized anxiety disorder: Secondary | ICD-10-CM | POA: Diagnosis not present

## 2024-05-30 DIAGNOSIS — F32A Depression, unspecified: Secondary | ICD-10-CM | POA: Diagnosis not present

## 2024-05-31 DIAGNOSIS — F32A Depression, unspecified: Secondary | ICD-10-CM | POA: Diagnosis not present

## 2024-05-31 DIAGNOSIS — F900 Attention-deficit hyperactivity disorder, predominantly inattentive type: Secondary | ICD-10-CM | POA: Diagnosis not present

## 2024-05-31 DIAGNOSIS — Z79899 Other long term (current) drug therapy: Secondary | ICD-10-CM | POA: Diagnosis not present

## 2024-05-31 DIAGNOSIS — Z5181 Encounter for therapeutic drug level monitoring: Secondary | ICD-10-CM | POA: Diagnosis not present

## 2024-06-12 DIAGNOSIS — F4323 Adjustment disorder with mixed anxiety and depressed mood: Secondary | ICD-10-CM | POA: Diagnosis not present

## 2024-06-28 DIAGNOSIS — Z79899 Other long term (current) drug therapy: Secondary | ICD-10-CM | POA: Diagnosis not present

## 2024-06-28 DIAGNOSIS — Z5181 Encounter for therapeutic drug level monitoring: Secondary | ICD-10-CM | POA: Diagnosis not present

## 2024-06-28 DIAGNOSIS — F900 Attention-deficit hyperactivity disorder, predominantly inattentive type: Secondary | ICD-10-CM | POA: Diagnosis not present

## 2024-06-28 DIAGNOSIS — F32A Depression, unspecified: Secondary | ICD-10-CM | POA: Diagnosis not present

## 2024-06-28 DIAGNOSIS — F411 Generalized anxiety disorder: Secondary | ICD-10-CM | POA: Diagnosis not present

## 2024-06-30 DIAGNOSIS — Z419 Encounter for procedure for purposes other than remedying health state, unspecified: Secondary | ICD-10-CM | POA: Diagnosis not present
